# Patient Record
Sex: Female | Born: 2000
Health system: Southern US, Community
[De-identification: ages and names within clinical notes are randomized; demographics above are authoritative.]

## PROBLEM LIST (undated history)

## (undated) ENCOUNTER — Inpatient Hospital Stay (HOSPITAL_COMMUNITY): Payer: Self-pay

## (undated) ENCOUNTER — Emergency Department (HOSPITAL_COMMUNITY): Payer: MEDICAID

## (undated) DIAGNOSIS — D649 Anemia, unspecified: Secondary | ICD-10-CM

## (undated) DIAGNOSIS — Z789 Other specified health status: Secondary | ICD-10-CM

## (undated) HISTORY — DX: Anemia, unspecified: D64.9

## (undated) HISTORY — PX: NO PAST SURGERIES: SHX2092

---
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
## 2023-01-06 DIAGNOSIS — N3090 Cystitis, unspecified without hematuria: Secondary | ICD-10-CM

## 2023-01-06 HISTORY — DX: Cystitis, unspecified without hematuria: N30.90

## 2023-01-06 NOTE — L&D Delivery Note (Signed)
 [facility] Delivery Note  Allison Stout is a 23 y.o. G1P1001 s/p SVD at [redacted]w[redacted]d. She was admitted for IOL for borderline FGR with elevated dopplers.   ROM: 9h 12m with clear fluid GBS Status: Positive/-- (04/22 0000) adequate treatment   Labor Progress: Initial SVE: closed/thick/high. She then progressed to complete.   Delivery Date/Time: 10/14/2023 at 1633 Delivery: Called to room and patient was complete and pushing. Head delivered LOA. No nuchal cord present. Shoulder and body delivered in usual fashion. Infant with spontaneous cry, placed on mother's abdomen, dried and stimulated. Cord clamped x 2 after 1-minute delay, and cut by FOB. Cord blood drawn. Placenta delivered spontaneously with gentle cord traction. Fundus firm with massage and Pitocin. Labia, perineum, vagina, and cervix inspected inspected with bilateral first degree labial lacerations observed. Repaired with 3-0 vicryl in the usual fashion.   Baby Weight: 2460g  Placenta: Sent to L&D Complications: None Lacerations: Bilateral 1st degree labial lacerations EBL: 150 mL Analgesia: Epidural   Infant:  APGAR (1 MIN): 7  APGAR (5 MINS): 9

## 2023-03-10 ENCOUNTER — Inpatient Hospital Stay (HOSPITAL_COMMUNITY)
Admission: AD | Admit: 2023-03-10 | Discharge: 2023-03-11 | Disposition: A | Discharge status: Home / Self Care | Payer: MEDICAID | Attending: Obstetrics and Gynecology | Admitting: Obstetrics and Gynecology

## 2023-03-10 DIAGNOSIS — O26899 Other specified pregnancy related conditions, unspecified trimester: Secondary | ICD-10-CM

## 2023-03-10 DIAGNOSIS — R102 Pelvic and perineal pain: Secondary | ICD-10-CM | POA: Insufficient documentation

## 2023-03-10 DIAGNOSIS — O26891 Other specified pregnancy related conditions, first trimester: Secondary | ICD-10-CM | POA: Insufficient documentation

## 2023-03-10 DIAGNOSIS — Z3A01 Less than 8 weeks gestation of pregnancy: Secondary | ICD-10-CM

## 2023-03-10 DIAGNOSIS — O3680X0 Pregnancy with inconclusive fetal viability, not applicable or unspecified: Secondary | ICD-10-CM

## 2023-03-10 LAB — POCT PREGNANCY, URINE: Preg Test, Ur: POSITIVE — AB

## 2023-03-11 ENCOUNTER — Inpatient Hospital Stay (HOSPITAL_COMMUNITY): Payer: MEDICAID

## 2023-03-11 ENCOUNTER — Encounter (HOSPITAL_COMMUNITY): Payer: Self-pay | Admitting: Obstetrics and Gynecology

## 2023-03-11 DIAGNOSIS — Z3A01 Less than 8 weeks gestation of pregnancy: Secondary | ICD-10-CM

## 2023-03-11 DIAGNOSIS — O3680X0 Pregnancy with inconclusive fetal viability, not applicable or unspecified: Secondary | ICD-10-CM

## 2023-03-11 DIAGNOSIS — R109 Unspecified abdominal pain: Secondary | ICD-10-CM

## 2023-03-11 DIAGNOSIS — O26891 Other specified pregnancy related conditions, first trimester: Secondary | ICD-10-CM

## 2023-03-11 LAB — COMPREHENSIVE METABOLIC PANEL
ALT: 18 U/L (ref 0–44)
AST: 17 U/L (ref 15–41)
Albumin: 3.5 g/dL (ref 3.5–5.0)
Alkaline Phosphatase: 39 U/L (ref 38–126)
Anion gap: 7 (ref 5–15)
BUN: 7 mg/dL (ref 6–20)
CO2: 23 mmol/L (ref 22–32)
Calcium: 9.3 mg/dL (ref 8.9–10.3)
Chloride: 105 mmol/L (ref 98–111)
Creatinine, Ser: 0.54 mg/dL (ref 0.44–1.00)
GFR, Estimated: >60 mL/min (ref >60)
Glucose, Bld: 96 mg/dL (ref 70–99)
Potassium: 3.7 mmol/L (ref 3.5–5.1)
Sodium: 135 mmol/L (ref 135–145)
Total Bilirubin: 0.5 mg/dL (ref 0.0–1.2)
Total Protein: 7.8 g/dL (ref 6.5–8.1)

## 2023-03-11 LAB — WET PREP, GENITAL
Clue Cells Wet Prep HPF POC: NONE SEEN
Sperm: NONE SEEN
Trich, Wet Prep: NONE SEEN
WBC, Wet Prep HPF POC: <10 (ref <10)
Yeast Wet Prep HPF POC: NONE SEEN

## 2023-03-11 LAB — CBC
HCT: 29.2 % — ABNORMAL LOW (ref 36.0–46.0)
Hemoglobin: 8.6 g/dL — ABNORMAL LOW (ref 12.0–15.0)
MCH: 21.2 pg — ABNORMAL LOW (ref 26.0–34.0)
MCHC: 29.5 g/dL — ABNORMAL LOW (ref 30.0–36.0)
MCV: 71.9 fL — ABNORMAL LOW (ref 80.0–100.0)
Platelets: 471 10*3/uL — ABNORMAL HIGH (ref 150–400)
RBC: 4.06 MIL/uL (ref 3.87–5.11)
RDW: 18.3 % — ABNORMAL HIGH (ref 11.5–15.5)
WBC: 8.4 10*3/uL (ref 4.0–10.5)
nRBC: 0 % (ref 0.0–0.2)

## 2023-03-11 LAB — HIV ANTIBODY (ROUTINE TESTING W REFLEX): HIV Screen 4th Generation wRfx: NONREACTIVE

## 2023-03-11 LAB — GC/CHLAMYDIA PROBE AMP (~~LOC~~) NOT AT ARMC
Chlamydia: NEGATIVE
Comment: NEGATIVE
Comment: NORMAL
Neisseria Gonorrhea: NEGATIVE

## 2023-03-11 LAB — ABO/RH: ABO/RH(D): O POS

## 2023-03-11 LAB — HCG, QUANTITATIVE, PREGNANCY: hCG, Beta Chain, Quant, S: 16012 m[IU]/mL — ABNORMAL HIGH (ref <5)

## 2023-03-11 NOTE — MAU Provider Note (Signed)
 Chief Complaint: Possible Pregnancy and Abdominal Pain   Event Date/Time   First Provider Initiated Contact with Patient 03/11/23 0236        SUBJECTIVE HPI: Allison Stout is a 23 y.o. No obstetric history on file. at Unknown by LMP who presents to maternity admissions reporting positive pregnancy test and abdominal cramping.  . She denies vaginal bleeding, vaginal itching/burning, urinary symptoms, n/v, or fever/chills.     Possible Pregnancy This is a new problem. Associated symptoms include abdominal pain. Pertinent negatives include no chills or nausea.  Abdominal Pain The current episode started today. The problem occurs rarely. The problem has been resolved since onset. The pain is located in the suprapubic region. The patient is experiencing no pain. Pertinent negatives include no diarrhea, dysuria or nausea. Past treatments include nothing.   RN Note: Allison Stout is a 24 y.o. at Unknown here in MAU reporting: had positive pregnancy test a couple of days ago. Had abdominal cramping earlier around 1pm, went away on its own.  LMP: middle of Jan  Pain score: 0/10  No past medical history on file. No past surgical history on file. Social History   Socioeconomic History   Marital status: Single    Spouse name: Not on file   Number of children: Not on file   Years of education: Not on file   Highest education level: Not on file  Occupational History   Not on file  Tobacco Use   Smoking status: Passive Smoke Exposure - Never Smoker   Smokeless tobacco: Not on file  Substance and Sexual Activity   Alcohol use: No   Drug use: No   Sexual activity: Never  Other Topics Concern   Not on file  Social History Narrative   Not on file   Social Drivers of Health   Financial Resource Strain: Not on file  Food Insecurity: Not on file  Transportation Needs: Not on file  Physical Activity: Not on file  Stress: Not on file  Social Connections: Not on file  Intimate Partner  Violence: Not on file   No current facility-administered medications on file prior to encounter.   No current outpatient medications on file prior to encounter.   No Known Allergies  I have reviewed patient's Past Medical Hx, Surgical Hx, Family Hx, Social Hx, medications and allergies.   ROS:  Review of Systems  Constitutional:  Negative for chills.  Gastrointestinal:  Positive for abdominal pain. Negative for diarrhea and nausea.  Genitourinary:  Negative for dysuria.   Review of Systems  Other systems negative   Physical Exam  Physical Exam Patient Vitals for the past 24 hrs:  BP Temp Temp src Pulse Resp SpO2 Height Weight  03/11/23 0157 125/62 98.2 F (36.8 C) Oral 76 16 100 % 5\' 2"  (1.575 m) 107.8 kg   Constitutional: Well-developed, well-nourished female in no acute distress.  Cardiovascular: normal rate Respiratory: normal effort GI: Abd soft, non-tender.  MS: Extremities nontender, no edema, normal ROM Neurologic: Alert and oriented x 4.  GU: Neg CVAT.  PELVIC EXAM: deferred  LAB RESULTS Results for orders placed or performed during the hospital encounter of 03/10/23 (from the past 24 hours)  Pregnancy, urine POC     Status: Abnormal   Collection Time: 03/10/23 11:30 PM  Result Value Ref Range   Preg Test, Ur POSITIVE (A) NEGATIVE  CBC     Status: Abnormal   Collection Time: 03/11/23  1:33 AM  Result Value Ref Range   WBC  8.4 4.0 - 10.5 K/uL   RBC 4.06 3.87 - 5.11 MIL/uL   Hemoglobin 8.6 (L) 12.0 - 15.0 g/dL   HCT 16.1 (L) 09.6 - 04.5 %   MCV 71.9 (L) 80.0 - 100.0 fL   MCH 21.2 (L) 26.0 - 34.0 pg   MCHC 29.5 (L) 30.0 - 36.0 g/dL   RDW 40.9 (H) 81.1 - 91.4 %   Platelets 471 (H) 150 - 400 K/uL   nRBC 0.0 0.0 - 0.2 %  hCG, quantitative, pregnancy     Status: Abnormal   Collection Time: 03/11/23  1:33 AM  Result Value Ref Range   hCG, Beta Chain, Quant, S 16,012 (H) <5 mIU/mL  ABO/Rh     Status: None   Collection Time: 03/11/23  1:33 AM  Result Value  Ref Range   ABO/RH(D) O POS    No rh immune globuloin      NOT A RH IMMUNE GLOBULIN CANDIDATE, PT RH POSITIVE Performed at [facility], [address], [place], [place] [address]   Comprehensive metabolic panel     Status: None   Collection Time: 03/11/23  1:33 AM  Result Value Ref Range   Sodium 135 135 - 145 mmol/L   Potassium 3.7 3.5 - 5.1 mmol/L   Chloride 105 98 - 111 mmol/L   CO2 23 22 - 32 mmol/L   Glucose, Bld 96 70 - 99 mg/dL   BUN 7 6 - 20 mg/dL   Creatinine, Ser 6.21 0.44 - 1.00 mg/dL   Calcium 9.3 8.9 - 30.8 mg/dL   Total Protein 7.8 6.5 - 8.1 g/dL   Albumin 3.5 3.5 - 5.0 g/dL   AST 17 15 - 41 U/L   ALT 18 0 - 44 U/L   Alkaline Phosphatase 39 38 - 126 U/L   Total Bilirubin 0.5 0.0 - 1.2 mg/dL   GFR, Estimated >65 >78 mL/min   Anion gap 7 5 - 15  HIV Antibody (routine testing w rflx)     Status: None   Collection Time: 03/11/23  1:33 AM  Result Value Ref Range   HIV Screen 4th Generation wRfx Non Reactive Non Reactive  Wet prep, genital     Status: None   Collection Time: 03/11/23  2:00 AM  Result Value Ref Range   Yeast Wet Prep HPF POC NONE SEEN NONE SEEN   Trich, Wet Prep NONE SEEN NONE SEEN   Clue Cells Wet Prep HPF POC NONE SEEN NONE SEEN   WBC, Wet Prep HPF POC <10 <10   Sperm NONE SEEN     --/--/O POS (03/06 0133)  IMAGING US OB LESS THAN 14 WEEKS WITH OB TRANSVAGINAL Result Date: 03/11/2023 CLINICAL DATA:  Cramping. Positive urine pregnancy test. Beta HCG pending. LMP mid-January EXAM: OBSTETRIC <14 WK [place] AND TRANSVAGINAL OB US TECHNIQUE: Both transabdominal and transvaginal ultrasound examinations were performed for complete evaluation of the gestation as well as the maternal uterus, adnexal regions, and pelvic cul-de-sac. Transvaginal technique was performed to assess early pregnancy. COMPARISON:  None Available. FINDINGS: Intrauterine gestational sac: Single Yolk sac:  Visualized. Embryo:  Visualized. Cardiac Activity: Visualized. Heart Rate: 119  bpm CRL:  4.6 mm   6 w   1 d                  [place] EDC: 11/03/2023 Subchorionic hemorrhage:  None visualized. Maternal uterus/adnexae: Normal ovaries.  No free fluid. IMPRESSION: Single living intrauterine gestation as described. No unexpected sonographic findings. Electronically  Signed   By: Minerva Fester M.D.   On: 03/11/2023 02:54     MAU Management/MDM: I have reviewed the triage vital signs and the nursing notes.   Pertinent labs & imaging results that were available during my care of the patient were reviewed by me and considered in my medical decision making (see chart for details).      I have reviewed her medical records including past results, notes and treatments. Medical, Surgical, and family history were reviewed.  Medications and recent lab tests were reviewed  Ordered usual first trimester r/o ectopic labs.   Pelvic cultures done Will check baseline Ultrasound to rule out ectopic.   This bleeding/pain can represent a normal pregnancy with bleeding, spontaneous abortion or even an ectopic which can be life-threatening.  The process as listed above helps to determine which of these is present.  Reviewed findings with patient who is very pleased. Discussed starting prenatal care  ASSESSMENT Single IUP at [redacted]w[redacted]d Pelvic pain, resolved Pregnancy unknown location, now found to be intrauterine  PLAN Discharge home List of OB providers given  Pt stable at time of discharge. Encouraged to return here if she develops worsening of symptoms, increase in pain, fever, or other concerning symptoms.    Wynelle Bourgeois CNM, MSN Certified Nurse-Midwife 03/11/2023  2:36 AM

## 2023-03-11 NOTE — MAU Note (Signed)
..  Allison Stout is a 23 y.o. at Unknown here in MAU reporting: had positive pregnancy test a couple of days ago. Had abdominal cramping earlier around 1pm, went away on its own.  LMP: middle of Jan  Pain score: 0/10 Vitals:   03/11/23 0157  BP: 125/62  Pulse: 76  Resp: 16  Temp: 98.2 F (36.8 C)  SpO2: 100%     FHT:n/a

## 2023-04-16 ENCOUNTER — Ambulatory Visit: Payer: Self-pay | Admitting: Family Medicine

## 2023-04-22 ENCOUNTER — Inpatient Hospital Stay (HOSPITAL_COMMUNITY)
Admission: AD | Admit: 2023-04-22 | Discharge: 2023-04-23 | Discharge status: Against Medical Advice | Payer: MEDICAID | Attending: Obstetrics & Gynecology | Admitting: Obstetrics & Gynecology

## 2023-04-22 DIAGNOSIS — W19XXXA Unspecified fall, initial encounter: Secondary | ICD-10-CM | POA: Insufficient documentation

## 2023-04-22 DIAGNOSIS — Z3A12 12 weeks gestation of pregnancy: Secondary | ICD-10-CM

## 2023-04-22 DIAGNOSIS — M545 Low back pain, unspecified: Secondary | ICD-10-CM | POA: Insufficient documentation

## 2023-04-22 DIAGNOSIS — Z5321 Procedure and treatment not carried out due to patient leaving prior to being seen by health care provider: Secondary | ICD-10-CM | POA: Insufficient documentation

## 2023-04-22 LAB — URINALYSIS, ROUTINE W REFLEX MICROSCOPIC
Bacteria, UA: NONE SEEN
Bilirubin Urine: NEGATIVE
Glucose, UA: NEGATIVE mg/dL
Ketones, ur: NEGATIVE mg/dL
Nitrite: NEGATIVE
Protein, ur: NEGATIVE mg/dL
Specific Gravity, Urine: 1.008 (ref 1.005–1.030)
pH: 6 (ref 5.0–8.0)

## 2023-04-22 NOTE — MAU Note (Signed)
.  Allison Stout is a 23 y.o. at [redacted]w[redacted]d here in MAU reporting falling back on her buttocks about 1915. Denies vag bleeding. Having some lower back pain.   LMP: n/a Onset of complaint: 1915 Pain score: 6 Vitals:   04/22/23 2045  Pulse: 78  Resp: 17  Temp: 98.1 F (36.7 C)  SpO2: 100%     FHT: 155  Lab orders placed from triage: u/a

## 2023-04-23 MED ORDER — CYCLOBENZAPRINE HCL 5 MG PO TABS: 10.0000 mg | ORAL_TABLET | Freq: Once | ORAL | Status: DC

## 2023-04-23 NOTE — MAU Note (Signed)
 Pt has been called twice and not in lobby.

## 2023-04-23 NOTE — MAU Note (Signed)
 Pt not in lobby.

## 2023-04-27 ENCOUNTER — Inpatient Hospital Stay (HOSPITAL_COMMUNITY)
Admission: AD | Admit: 2023-04-27 | Discharge: 2023-04-27 | Disposition: A | Discharge status: Home / Self Care | Payer: MEDICAID | Attending: Obstetrics and Gynecology | Admitting: Obstetrics and Gynecology

## 2023-04-27 ENCOUNTER — Encounter (HOSPITAL_COMMUNITY): Payer: Self-pay | Admitting: Obstetrics and Gynecology

## 2023-04-27 DIAGNOSIS — Z3A12 12 weeks gestation of pregnancy: Secondary | ICD-10-CM | POA: Insufficient documentation

## 2023-04-27 DIAGNOSIS — O2311 Infections of bladder in pregnancy, first trimester: Secondary | ICD-10-CM | POA: Insufficient documentation

## 2023-04-27 DIAGNOSIS — N3000 Acute cystitis without hematuria: Secondary | ICD-10-CM

## 2023-04-27 DIAGNOSIS — M62838 Other muscle spasm: Secondary | ICD-10-CM | POA: Insufficient documentation

## 2023-04-27 DIAGNOSIS — B951 Streptococcus, group B, as the cause of diseases classified elsewhere: Secondary | ICD-10-CM | POA: Insufficient documentation

## 2023-04-27 HISTORY — DX: Other specified health status: Z78.9

## 2023-04-27 LAB — WET PREP, GENITAL
Clue Cells Wet Prep HPF POC: NONE SEEN
Sperm: NONE SEEN
Trich, Wet Prep: NONE SEEN
WBC, Wet Prep HPF POC: >=10 — AB (ref <10)
Yeast Wet Prep HPF POC: NONE SEEN

## 2023-04-27 LAB — URINALYSIS, ROUTINE W REFLEX MICROSCOPIC
Bilirubin Urine: NEGATIVE
Glucose, UA: NEGATIVE mg/dL
Hgb urine dipstick: NEGATIVE
Ketones, ur: NEGATIVE mg/dL
Nitrite: NEGATIVE
Protein, ur: 30 mg/dL — AB
Specific Gravity, Urine: 1.029 (ref 1.005–1.030)
pH: 5 (ref 5.0–8.0)

## 2023-04-27 LAB — OB RESULTS CONSOLE GBS: GBS: POSITIVE

## 2023-04-27 MED ORDER — CEPHALEXIN 500 MG PO CAPS: 500.0000 mg | ORAL_CAPSULE | Freq: Two times a day (BID) | ORAL | 0 refills | Status: AC

## 2023-04-27 MED ORDER — ONDANSETRON 4 MG PO TBDP: 4.0000 mg | ORAL_TABLET | Freq: Once | ORAL | Status: DC

## 2023-04-27 MED ORDER — CYCLOBENZAPRINE HCL 5 MG PO TABS: 5.0000 mg | ORAL_TABLET | Freq: Three times a day (TID) | ORAL | 0 refills | Status: DC | PRN

## 2023-04-27 NOTE — MAU Note (Signed)
 Allison Stout is a 23 y.o. at [redacted]w[redacted]d here in MAU reporting: pt eating [employer] when called into triage.  Started throwing up  Sunday  until last night , was consistent.  After she quit throwing up, she started having pain in her lower stomach. Started about midnight last night.  Saw some spotting last night when she wiped after urinating. (Only saw the one time). Doesn't know if she was wiping to hard or what. Had intercourse yesterday morning.  Onset of complaint: yesterday Pain score: 6 Vitals:   04/27/23 1040  BP: 127/68  Pulse: 87  Resp: 17  Temp: 98.9 F (37.2 C)  SpO2: 100%     FHT:152 Lab orders placed from triage:  urine and vag swabs

## 2023-04-27 NOTE — MAU Provider Note (Signed)
 History     CSN: [id number]  Arrival date and time: 04/27/23 1015   Event Date/Time   First Provider Initiated Contact with Patient 04/27/2023 10:40 AM   Chief Complaint  Patient presents with   Abdominal Pain   Vaginal Bleeding    HPI  Allison Stout is a 23 y.o. G1P0 at [redacted]w[redacted]d who presents to the MAU for lower abdominal pain. Started yesterday. Reports nausea with vomiting yesterday, then began having lower abdominal pain subsequently. She also noticed some spotting (red blood, no clots) yesterday, but was unsure if it was because she was wiping too aggressively. She reports her main concern is her right low back pain -- started after she fell on her buttocks last week. She presented to the MAU but left w/o being seen at the time. Reports pain has been consistent since then. No numbness/tingling of bilateral lower extremities, loss of bowel/bladder function. Endorses dysuria. No VB currently.  Past Medical History:  Diagnosis Date   Medical history non-contributory     Past Surgical History:  Procedure Laterality Date   NO PAST SURGERIES      Family History  Problem Relation Age of Onset   Healthy Mother    Healthy Father     Social History   Tobacco Use   Smoking status: Passive Smoke Exposure - Never Smoker  Vaping Use   Vaping status: Never Used  Substance Use Topics   Alcohol use: No   Drug use: No    Allergies: No Known Allergies  No medications prior to admission.    ROS reviewed and pertinent positives and negatives as documented in HPI.  Physical Exam   Blood pressure 121/81, pulse 91, temperature 98.9 F (37.2 C), temperature source Oral, resp. rate 17, height 5\' 2"  (1.575 m), weight 103.4 kg, SpO2 100%.  Physical Exam Constitutional:      General: She is not in acute distress.    Appearance: Normal appearance. She is not ill-appearing.  HENT:     Head: Normocephalic and atraumatic.  Cardiovascular:     Rate and Rhythm: Normal rate.   Pulmonary:     Effort: Pulmonary effort is normal.     Breath sounds: Normal breath sounds.  Abdominal:     Palpations: Abdomen is soft.     Tenderness: There is no abdominal tenderness. There is no guarding.  Musculoskeletal:        General: Normal range of motion.     Comments: Right lumbar paraspinal muscle tenderness  Skin:    General: Skin is warm and dry.     Findings: No rash.  Neurological:     General: No focal deficit present.     Mental Status: She is alert and oriented to person, place, and time.     MAU Course  Procedures  MDM 23 y.o. G1P0 at [redacted]w[redacted]d presenting for abdominal pain and right sided low back pain. Abdominal pain is mainly suprapubic and accompanied by complaint of dysuria. Did have postcoital spotting yesterday, none today. She also reports fall last week onto buttocks w consistent pain. Has not tried medication for low back pain. Discussed conservative management w Tylenol, heat, Rx for Flexeril  vs XR and pt would like to start w conservative measures. Return precautions given.  Assessment and Plan     ICD-10-CM   1. Acute cystitis without hematuria  N30.00     2. Muscle spasm  M62.838     Rx for Keflex  sent for acute cystitis, urine cx ordered Rx  for Flexeril  sent, rec Tylenol and heat for paraspinal muscle tenderness, discussed return precautions and would order XR if pain refractory to above   Melanie Spires, MD OB Fellow, Faculty [facility], Center for [facility]  04/27/2023, 4:07 PM

## 2023-04-28 LAB — CULTURE, OB URINE: Culture: 10000 — AB

## 2023-04-28 LAB — GC/CHLAMYDIA PROBE AMP (~~LOC~~) NOT AT ARMC
Chlamydia: NEGATIVE
Comment: NEGATIVE
Comment: NORMAL
Neisseria Gonorrhea: NEGATIVE

## 2023-04-29 ENCOUNTER — Encounter: Payer: Self-pay | Admitting: Obstetrics and Gynecology

## 2023-04-29 DIAGNOSIS — Z22330 Carrier of Group B streptococcus: Secondary | ICD-10-CM | POA: Insufficient documentation

## 2023-05-01 NOTE — Progress Notes (Signed)
 GBS bacteruria, at 10,000 CFU so no indication for tx but will tx intrapartum. Has >100,000 diphtheroids, Keflex  should cover (susceptibilities not available per report).

## 2023-06-12 ENCOUNTER — Encounter (HOSPITAL_COMMUNITY): Payer: Self-pay | Admitting: Obstetrics & Gynecology

## 2023-06-12 ENCOUNTER — Inpatient Hospital Stay (HOSPITAL_COMMUNITY)
Admission: AD | Admit: 2023-06-12 | Discharge: 2023-06-12 | Disposition: A | Discharge status: Home / Self Care | Payer: Self-pay | Attending: Obstetrics & Gynecology | Admitting: Obstetrics & Gynecology

## 2023-06-12 ENCOUNTER — Inpatient Hospital Stay (HOSPITAL_COMMUNITY): Payer: Self-pay

## 2023-06-12 ENCOUNTER — Other Ambulatory Visit: Payer: Self-pay

## 2023-06-12 DIAGNOSIS — O4692 Antepartum hemorrhage, unspecified, second trimester: Secondary | ICD-10-CM | POA: Insufficient documentation

## 2023-06-12 DIAGNOSIS — Z3A19 19 weeks gestation of pregnancy: Secondary | ICD-10-CM

## 2023-06-12 DIAGNOSIS — O0932 Supervision of pregnancy with insufficient antenatal care, second trimester: Secondary | ICD-10-CM | POA: Insufficient documentation

## 2023-06-12 DIAGNOSIS — O209 Hemorrhage in early pregnancy, unspecified: Secondary | ICD-10-CM

## 2023-06-12 DIAGNOSIS — O468X2 Other antepartum hemorrhage, second trimester: Secondary | ICD-10-CM

## 2023-06-12 LAB — DIFFERENTIAL
Abs Immature Granulocytes: 0.03 10*3/uL (ref 0.00–0.07)
Basophils Absolute: 0 10*3/uL (ref 0.0–0.1)
Basophils Relative: 0 %
Eosinophils Absolute: 0.1 10*3/uL (ref 0.0–0.5)
Eosinophils Relative: 1 %
Immature Granulocytes: 0 %
Lymphocytes Relative: 16 %
Lymphs Abs: 1.8 10*3/uL (ref 0.7–4.0)
Monocytes Absolute: 0.7 10*3/uL (ref 0.1–1.0)
Monocytes Relative: 6 %
Neutro Abs: 8.6 10*3/uL — ABNORMAL HIGH (ref 1.7–7.7)
Neutrophils Relative %: 77 %

## 2023-06-12 LAB — URINALYSIS, ROUTINE W REFLEX MICROSCOPIC
Bilirubin Urine: NEGATIVE
Glucose, UA: NEGATIVE mg/dL
Ketones, ur: NEGATIVE mg/dL
Leukocytes,Ua: NEGATIVE
Nitrite: NEGATIVE
Protein, ur: 30 mg/dL — AB
RBC / HPF: >50 RBC/hpf (ref 0–5)
Specific Gravity, Urine: 1.024 (ref 1.005–1.030)
pH: 7 (ref 5.0–8.0)

## 2023-06-12 LAB — CBC
HCT: 30.5 % — ABNORMAL LOW (ref 36.0–46.0)
Hemoglobin: 9.2 g/dL — ABNORMAL LOW (ref 12.0–15.0)
MCH: 21.8 pg — ABNORMAL LOW (ref 26.0–34.0)
MCHC: 30.2 g/dL (ref 30.0–36.0)
MCV: 72.3 fL — ABNORMAL LOW (ref 80.0–100.0)
Platelets: 441 10*3/uL — ABNORMAL HIGH (ref 150–400)
RBC: 4.22 MIL/uL (ref 3.87–5.11)
RDW: 19.6 % — ABNORMAL HIGH (ref 11.5–15.5)
WBC: 11.2 10*3/uL — ABNORMAL HIGH (ref 4.0–10.5)
nRBC: 0 % (ref 0.0–0.2)

## 2023-06-12 LAB — RAPID HIV SCREEN (HIV 1/2 AB+AG)
HIV 1/2 Antibodies: NONREACTIVE
HIV-1 P24 Antigen - HIV24: NONREACTIVE

## 2023-06-12 LAB — TYPE AND SCREEN
ABO/RH(D): O POS
Antibody Screen: NEGATIVE

## 2023-06-12 LAB — WET PREP, GENITAL
Clue Cells Wet Prep HPF POC: NONE SEEN
Sperm: NONE SEEN
Trich, Wet Prep: NONE SEEN
WBC, Wet Prep HPF POC: >=10 — AB (ref <10)
Yeast Wet Prep HPF POC: NONE SEEN

## 2023-06-12 LAB — HIV ANTIBODY (ROUTINE TESTING W REFLEX): HIV Screen 4th Generation wRfx: NONREACTIVE

## 2023-06-12 LAB — HEPATITIS B SURFACE ANTIGEN: Hepatitis B Surface Ag: NONREACTIVE

## 2023-06-12 NOTE — MAU Note (Signed)
 MAU Triage Note  Allison Stout is a 23 y.o. at [redacted]w[redacted]d here in MAU reporting: reports vaginal bleeding since 4pm, she hasn't used a pad, but she's soaked through her shorts that she's had on since 10am. She reports no clots.    Onset of complaint: approx 4pm Pain score: pelvis 1/10 cramp Vitals:   06/12/23 1829  BP: 123/79  Pulse: (!) 125  Resp: 17  Temp: 97.6 F (36.4 C)  SpO2: 100%     FHT: 152  Lab orders placed from triage: UA

## 2023-06-12 NOTE — MAU Provider Note (Cosign Needed Addendum)
 Chief Complaint:  Vaginal Bleeding   HPI    Allison Stout is a 23 y.o. G1P0 at [redacted]w[redacted]d who presents to maternity via EMS with c/o vaginal bleeding since 4 PM. Denies any heavy VB or cramping. She has not yet established PNC. Her Pregnancy was confirmed via [facility]  Pregnancy Course: Un-established   Past Medical History:  Diagnosis Date   Medical history non-contributory    OB History  Gravida Para Term Preterm AB Living  1       SAB IAB Ectopic Multiple Live Births          # Outcome Date GA Lbr Len/2nd Weight Sex Type Anes PTL Lv  1 Current            Past Surgical History:  Procedure Laterality Date   NO PAST SURGERIES     Family History  Problem Relation Age of Onset   Healthy Mother    Healthy Father    Social History   Tobacco Use   Smoking status: Passive Smoke Exposure - Never Smoker  Vaping Use   Vaping status: Never Used  Substance Use Topics   Alcohol use: No   Drug use: No   No Known Allergies No medications prior to admission.    I have reviewed patient's Past Medical Hx, Surgical Hx, Family Hx, Social Hx, medications and allergies.   ROS  Pertinent items noted in HPI and remainder of comprehensive ROS otherwise negative.   PHYSICAL EXAM   Patient Vitals for the past 24 hrs:  BP Temp Temp src Pulse Resp SpO2  06/12/23 2224 117/74 -- -- (!) 107 -- --  06/12/23 2223 94/71 -- -- 97 -- --  06/12/23 2013 124/69 -- -- (!) 101 -- --  06/12/23 1843 114/71 -- -- (!) 122 -- --  06/12/23 1829 123/79 97.6 F (36.4 C) Oral (!) 125 17 100 %    Constitutional: Well-developed, obese female in no acute distress.  Cardiovascular: normal rate & rhythm, warm and well-perfused Respiratory: normal effort, no problems with respiration noted GI: Abd soft, non-tender, gravid MS: Extremities nontender, no edema, normal ROM Neurologic: Alert and oriented x 4.  GU: no CVA tenderness Pelvic: Exam chaperoned by Hulda Mage RN  SSE: No evidence  of blood in the vault, cervix appears erythremic and bleeds to touch , cervix visually closed ( Vaginal Cultures sent)     Fetal Tracing: Via Doppler at 152    Labs: Results for orders placed or performed during the hospital encounter of 06/12/23 (from the past 24 hours)  Urinalysis, Routine w reflex microscopic -Urine, Clean Catch     Status: Abnormal   Collection Time: 06/12/23  6:40 PM  Result Value Ref Range   Color, Urine YELLOW YELLOW   APPearance HAZY (A) CLEAR   Specific Gravity, Urine 1.024 1.005 - 1.030   pH 7.0 5.0 - 8.0   Glucose, UA NEGATIVE NEGATIVE mg/dL   Hgb urine dipstick MODERATE (A) NEGATIVE   Bilirubin Urine NEGATIVE NEGATIVE   Ketones, ur NEGATIVE NEGATIVE mg/dL   Protein, ur 30 (A) NEGATIVE mg/dL   Nitrite NEGATIVE NEGATIVE   Leukocytes,Ua NEGATIVE NEGATIVE   RBC / HPF >50 0 - 5 RBC/hpf   WBC, UA 0-5 0 - 5 WBC/hpf   Bacteria, UA RARE (A) NONE SEEN   Squamous Epithelial / HPF 0-5 0 - 5 /HPF   Mucus PRESENT   Wet prep, genital     Status: Abnormal   Collection Time: 06/12/23  7:23 PM   Specimen: Cervix  Result Value Ref Range   Yeast Wet Prep HPF POC NONE SEEN NONE SEEN   Trich, Wet Prep NONE SEEN NONE SEEN   Clue Cells Wet Prep HPF POC NONE SEEN NONE SEEN   WBC, Wet Prep HPF POC >=10 (A) <10   Sperm NONE SEEN   Hepatitis B surface antigen     Status: None   Collection Time: 06/12/23  8:07 PM  Result Value Ref Range   Hepatitis B Surface Ag NON REACTIVE NON REACTIVE  CBC     Status: Abnormal   Collection Time: 06/12/23  8:07 PM  Result Value Ref Range   WBC 11.2 (H) 4.0 - 10.5 K/uL   RBC 4.22 3.87 - 5.11 MIL/uL   Hemoglobin 9.2 (L) 12.0 - 15.0 g/dL   HCT 95.6 (L) 21.3 - 08.6 %   MCV 72.3 (L) 80.0 - 100.0 fL   MCH 21.8 (L) 26.0 - 34.0 pg   MCHC 30.2 30.0 - 36.0 g/dL   RDW 57.8 (H) 46.9 - 62.9 %   Platelets 441 (H) 150 - 400 K/uL   nRBC 0.0 0.0 - 0.2 %  Differential     Status: Abnormal   Collection Time: 06/12/23  8:07 PM  Result Value Ref  Range   Neutrophils Relative % 77 %   Neutro Abs 8.6 (H) 1.7 - 7.7 K/uL   Lymphocytes Relative 16 %   Lymphs Abs 1.8 0.7 - 4.0 K/uL   Monocytes Relative 6 %   Monocytes Absolute 0.7 0.1 - 1.0 K/uL   Eosinophils Relative 1 %   Eosinophils Absolute 0.1 0.0 - 0.5 K/uL   Basophils Relative 0 %   Basophils Absolute 0.0 0.0 - 0.1 K/uL   Immature Granulocytes 0 %   Abs Immature Granulocytes 0.03 0.00 - 0.07 K/uL  Type and screen [facility]     Status: None   Collection Time: 06/12/23  8:07 PM  Result Value Ref Range   ABO/RH(D) O POS    Antibody Screen NEG    Sample Expiration      06/15/2023,2359 Performed at [facility], [address], [place], [place] [address]   HIV Antibody (routine testing w rflx)     Status: None   Collection Time: 06/12/23  8:07 PM  Result Value Ref Range   HIV Screen 4th Generation wRfx Non Reactive Non Reactive  Rapid HIV screen (HIV 1/2 Ab+Ag)     Status: None   Collection Time: 06/12/23  8:07 PM  Result Value Ref Range   HIV-1 P24 Antigen - HIV24 NON REACTIVE NON REACTIVE   HIV 1/2 Antibodies NON REACTIVE NON REACTIVE   Interpretation (HIV Ag Ab)      A non reactive test result means that HIV 1 or HIV 2 antibodies and HIV 1 p24 antigen were not detected in the specimen.    Imaging:  No results found.  MDM & MAU COURSE  MDM:  HIGH   Vaginal bleeding in pregnancy [redacted] weeks GA ( FHR 152) R/O Vaginal infections  UTI : R/O UTI No Prenatal Care: Routine Prenatal Labs ordered  Suspect vaginitis - No evidence of active infection at this time No active VB - Stable for discharge   MAU Course: Orders Placed This Encounter  Procedures   Wet prep, genital   US  MFM OB Limited   Urinalysis, Routine w reflex microscopic -Urine, Clean Catch   Hepatitis B surface antigen   [place]  screen   RPR   CBC   Differential   HIV Antibody (routine testing w rflx)   Rapid HIV screen (HIV 1/2 Ab+Ag)   Type and screen [facility]   Discharge patient Discharge disposition: 01-Home or Self Care; Discharge patient date: 06/12/2023   Discharge patient    ASSESSMENT   1. Vaginal bleeding affecting early pregnancy   2. Subchorionic hematoma in second trimester, single or unspecified fetus   3. [redacted] weeks gestation of pregnancy     PLAN  Discharge home in stable condition with return precautions.   Establish Prenatal Care ( List of OB Providers given)  See AVS for full description of information given to the patient including both verbal and written. Patient verbalized understanding and agrees with the plan as described above.      Allergies as of 06/12/2023   No Known Allergies      Medication List     TAKE these medications    cyclobenzaprine  5 MG tablet Commonly known as: FLEXERIL  Take 1 tablet (5 mg total) by mouth 3 (three) times daily as needed for muscle spasms. MAY CAUSE DROWSINESS, DO NOT OPERATE HEAVY MACHINERY AFTER TAKING THIS MEDICATION   PRENATAL ADULT GUMMY/DHA/FA PO Take 1 tablet by mouth daily.        Debbe Fail, MSN, [facility], Center for [facility]    Addendum:  Pt with bleeding reported at time of discharge and mild abdominal cramping.  Limited OB US  done to look at placental location.    If no previa identified, plan for discharge with bleeding precautions as previously planned.    Ultrasound showed a moderately large hematoma at edge of placenta.  Discussed that this is the likely cause of her bleeding.  Given its large size, there is a possibility for miscarriage/IUFD, PPROM.  Discussed pelvic rest and lifting restrictions.  Also discussed need to return for evaluation if she begins to pass large clots, has an increase in pain, fever/chills, clear fluid.  Maud Sorenson, MD 12:26 AM

## 2023-06-13 LAB — RPR: RPR Ser Ql: NONREACTIVE

## 2023-06-14 LAB — GC/CHLAMYDIA PROBE AMP (~~LOC~~) NOT AT ARMC
Chlamydia: NEGATIVE
Comment: NEGATIVE
Comment: NORMAL
Neisseria Gonorrhea: NEGATIVE

## 2023-06-15 LAB — RUBELLA SCREEN: Rubella: 1.67 {index} (ref >0.99)

## 2023-06-17 ENCOUNTER — Encounter (HOSPITAL_COMMUNITY): Payer: Self-pay | Admitting: Obstetrics & Gynecology

## 2023-06-17 ENCOUNTER — Inpatient Hospital Stay (HOSPITAL_COMMUNITY): Payer: Self-pay

## 2023-06-17 ENCOUNTER — Inpatient Hospital Stay (HOSPITAL_COMMUNITY)
Admission: AD | Admit: 2023-06-17 | Discharge: 2023-06-17 | Discharge status: Against Medical Advice | Payer: Self-pay | Attending: Obstetrics & Gynecology | Admitting: Obstetrics & Gynecology

## 2023-06-17 DIAGNOSIS — R1031 Right lower quadrant pain: Secondary | ICD-10-CM | POA: Insufficient documentation

## 2023-06-17 DIAGNOSIS — O99212 Obesity complicating pregnancy, second trimester: Secondary | ICD-10-CM

## 2023-06-17 DIAGNOSIS — E669 Obesity, unspecified: Secondary | ICD-10-CM

## 2023-06-17 DIAGNOSIS — O26892 Other specified pregnancy related conditions, second trimester: Secondary | ICD-10-CM

## 2023-06-17 DIAGNOSIS — O99012 Anemia complicating pregnancy, second trimester: Secondary | ICD-10-CM | POA: Insufficient documentation

## 2023-06-17 DIAGNOSIS — Z3A20 20 weeks gestation of pregnancy: Secondary | ICD-10-CM

## 2023-06-17 DIAGNOSIS — R102 Pelvic and perineal pain: Secondary | ICD-10-CM | POA: Insufficient documentation

## 2023-06-17 DIAGNOSIS — R109 Unspecified abdominal pain: Secondary | ICD-10-CM

## 2023-06-17 LAB — URINALYSIS, ROUTINE W REFLEX MICROSCOPIC
Bilirubin Urine: NEGATIVE
Glucose, UA: NEGATIVE mg/dL
Hgb urine dipstick: NEGATIVE
Ketones, ur: NEGATIVE mg/dL
Nitrite: NEGATIVE
Protein, ur: NEGATIVE mg/dL
Specific Gravity, Urine: 1.017 (ref 1.005–1.030)
pH: 6 (ref 5.0–8.0)

## 2023-06-17 LAB — COMPREHENSIVE METABOLIC PANEL WITH GFR
ALT: 13 U/L (ref 0–44)
AST: 14 U/L — ABNORMAL LOW (ref 15–41)
Albumin: 2.8 g/dL — ABNORMAL LOW (ref 3.5–5.0)
Alkaline Phosphatase: 60 U/L (ref 38–126)
Anion gap: 7 (ref 5–15)
BUN: 5 mg/dL — ABNORMAL LOW (ref 6–20)
CO2: 22 mmol/L (ref 22–32)
Calcium: 9.2 mg/dL (ref 8.9–10.3)
Chloride: 105 mmol/L (ref 98–111)
Creatinine, Ser: 0.51 mg/dL (ref 0.44–1.00)
GFR, Estimated: >60 mL/min (ref >60)
Glucose, Bld: 101 mg/dL — ABNORMAL HIGH (ref 70–99)
Potassium: 3.6 mmol/L (ref 3.5–5.1)
Sodium: 134 mmol/L — ABNORMAL LOW (ref 135–145)
Total Bilirubin: 0.2 mg/dL (ref 0.0–1.2)
Total Protein: 6.9 g/dL (ref 6.5–8.1)

## 2023-06-17 LAB — CBC
HCT: 29.2 % — ABNORMAL LOW (ref 36.0–46.0)
Hemoglobin: 8.8 g/dL — ABNORMAL LOW (ref 12.0–15.0)
MCH: 22.1 pg — ABNORMAL LOW (ref 26.0–34.0)
MCHC: 30.1 g/dL (ref 30.0–36.0)
MCV: 73.2 fL — ABNORMAL LOW (ref 80.0–100.0)
Platelets: 411 10*3/uL — ABNORMAL HIGH (ref 150–400)
RBC: 3.99 MIL/uL (ref 3.87–5.11)
RDW: 20 % — ABNORMAL HIGH (ref 11.5–15.5)
WBC: 9.4 10*3/uL (ref 4.0–10.5)
nRBC: 0 % (ref 0.0–0.2)

## 2023-06-17 LAB — WET PREP, GENITAL
Clue Cells Wet Prep HPF POC: NONE SEEN
Sperm: NONE SEEN
Trich, Wet Prep: NONE SEEN
WBC, Wet Prep HPF POC: >=10 — AB
Yeast Wet Prep HPF POC: NONE SEEN

## 2023-06-17 LAB — LIPASE, BLOOD: Lipase: 32 U/L (ref 11–51)

## 2023-06-17 LAB — AMYLASE: Amylase: 76 U/L (ref 28–100)

## 2023-06-17 MED ORDER — ACETAMINOPHEN 500 MG PO TABS
1000.0000 mg | ORAL_TABLET | Freq: Once | ORAL | Status: AC
  Administered 2023-06-17: 1000 mg via ORAL
  Filled 2023-06-17: qty 2

## 2023-06-17 NOTE — Discharge Instructions (Signed)
 Please contact one of the OB providers to establish care. It is vital that you receive routine prenatal care during pregnancy.  [facility] [employer] for [employer] at [employer] [facility]             [address], [place], [place] [address] [phone]  Center for [facility] at [facility]                                                             [address], [place], [place], [address] [phone]  Center for [facility] at [facility] [address], [place], [place], [address] [phone]  Center for [facility] at [facility] [address], [place], [place], [address] [phone]  Center for [facility] at [facility]                                 [address] [place], [place], [place], [address] [phone]  Center for [facility] at [facility]                                    [address], [place], [place], [address] [phone]  Center for [facility] at [facility] [address], [address], [place], [place], [address]                              [facility] [place] [address], [place], [place], [address] [phone]  [facility]         Phone: [phone]  [facility]      Phone: [phone]   [facility]      Phone: [phone]  [facility]-Family [facility]         Phone: [phone]   [facility]-M[facility]    Phone: [phone]  [facility]      Phone: [phone]  [facility]     Phone: [phone]  [facility]        Phone: [phone]  [facility]) [phone]  [facility]      Phone: [phone]

## 2023-06-17 NOTE — MAU Note (Signed)
 Pt had questions about MRI. Did not think she needed an MRI feeling a little better. Notified provider.

## 2023-06-17 NOTE — MAU Provider Note (Signed)
 Chief Complaint:  Abdominal Pain   HPI   Event Date/Time   First Provider Initiated Contact with Patient 06/17/23 1836      Allison Stout is a 23 y.o. G1P0 at [redacted]w[redacted]d who presents to maternity admissions reporting abdominal pain. She was in the MAU on 6/7 with vaginal bleeding, US  showed subchorionic hemorrhage. Vaginal bleeding has since resolved, but today she began having lower abdominal pain, particularly in the RLQ. She describes a constant stabbing pain. She has not tried anything at home for relief. Movement like walking aggravates the pain. Denies nausea, vomiting, diarrhea, constipation, fever, chills. No change in appetite, states she ate some Chipotle earlier today.  Pregnancy Course: Has received OB list several times while at MAU. No prenatal records available for review.  Past Medical History:  Diagnosis Date   Medical history non-contributory    OB History  Gravida Para Term Preterm AB Living  1       SAB IAB Ectopic Multiple Live Births          # Outcome Date GA Lbr Len/2nd Weight Sex Type Anes PTL Lv  1 Current            Past Surgical History:  Procedure Laterality Date   NO PAST SURGERIES     Family History  Problem Relation Age of Onset   Healthy Mother    Healthy Father    Social History   Tobacco Use   Smoking status: Passive Smoke Exposure - Never Smoker  Vaping Use   Vaping status: Never Used  Substance Use Topics   Alcohol use: No   Drug use: No   No Known Allergies Medications Prior to Admission  Medication Sig Dispense Refill Last Dose/Taking   cyclobenzaprine  (FLEXERIL ) 5 MG tablet Take 1 tablet (5 mg total) by mouth 3 (three) times daily as needed for muscle spasms. MAY CAUSE DROWSINESS, DO NOT OPERATE HEAVY MACHINERY AFTER TAKING THIS MEDICATION 20 tablet 0 Past Month   Prenatal MV & Min w/FA-DHA (PRENATAL ADULT GUMMY/DHA/FA PO) Take 1 tablet by mouth daily.   06/17/2023    I have reviewed patient's Past Medical Hx, Surgical Hx,  Family Hx, Social Hx, medications and allergies.   ROS  Pertinent items noted in HPI and remainder of comprehensive ROS otherwise negative.   PHYSICAL EXAM  Patient Vitals for the past 24 hrs:  BP Temp Temp src Pulse Resp SpO2 Height Weight  06/17/23 1802 122/60 98.8 F (37.1 C) Oral 92 18 99 % 5' 2 (1.575 m) 105.6 kg    Constitutional: Well-developed, well-nourished female sitting upright in bed.  HEENT: atraumatic, normocephalic. Neck has normal ROM. EOM intact. Cardiovascular: normal rate & rhythm, warm and well-perfused Respiratory: normal effort, no problems with respiration noted GI: Abd soft, non-distended, normoactive bowel sounds. When LLQ palpated, pain in RLQ. RLQ and RUQ tender to light palpation. MSK: Extremities nontender, no edema, normal ROM Skin: warm and dry. Acyanotic, no jaundice or pallor. Neurologic: Alert and oriented x 4. No abnormal coordination. Psychiatric: Normal mood. Speech not slurred, not rapid/pressured. Patient is cooperative. GU: no CVA tenderness    Labs: Results for orders placed or performed during the hospital encounter of 06/17/23 (from the past 24 hours)  CBC     Status: Abnormal   Collection Time: 06/17/23  6:47 PM  Result Value Ref Range   WBC 9.4 4.0 - 10.5 K/uL   RBC 3.99 3.87 - 5.11 MIL/uL   Hemoglobin 8.8 (L) 12.0 - 15.0 g/dL  HCT 29.2 (L) 36.0 - 46.0 %   MCV 73.2 (L) 80.0 - 100.0 fL   MCH 22.1 (L) 26.0 - 34.0 pg   MCHC 30.1 30.0 - 36.0 g/dL   RDW 13.2 (H) 44.0 - 10.2 %   Platelets 411 (H) 150 - 400 K/uL   nRBC 0.0 0.0 - 0.2 %  Comprehensive metabolic panel with GFR     Status: Abnormal   Collection Time: 06/17/23  6:47 PM  Result Value Ref Range   Sodium 134 (L) 135 - 145 mmol/L   Potassium 3.6 3.5 - 5.1 mmol/L   Chloride 105 98 - 111 mmol/L   CO2 22 22 - 32 mmol/L   Glucose, Bld 101 (H) 70 - 99 mg/dL   BUN 5 (L) 6 - 20 mg/dL   Creatinine, Ser 7.25 0.44 - 1.00 mg/dL   Calcium 9.2 8.9 - 36.6 mg/dL   Total Protein 6.9  6.5 - 8.1 g/dL   Albumin 2.8 (L) 3.5 - 5.0 g/dL   AST 14 (L) 15 - 41 U/L   ALT 13 0 - 44 U/L   Alkaline Phosphatase 60 38 - 126 U/L   Total Bilirubin 0.2 0.0 - 1.2 mg/dL   GFR, Estimated >44 >03 mL/min   Anion gap 7 5 - 15  Amylase     Status: None   Collection Time: 06/17/23  6:47 PM  Result Value Ref Range   Amylase 76 28 - 100 U/L  Lipase, blood     Status: None   Collection Time: 06/17/23  6:47 PM  Result Value Ref Range   Lipase 32 11 - 51 U/L  Wet prep, genital     Status: Abnormal   Collection Time: 06/17/23  7:30 PM   Specimen: PATH Cytology Cervicovaginal Ancillary Only  Result Value Ref Range   Yeast Wet Prep HPF POC NONE SEEN NONE SEEN   Trich, Wet Prep NONE SEEN NONE SEEN   Clue Cells Wet Prep HPF POC NONE SEEN NONE SEEN   WBC, Wet Prep HPF POC >=10 (A) <10   Sperm NONE SEEN   Urinalysis, Routine w reflex microscopic -Urine, Clean Catch     Status: Abnormal   Collection Time: 06/17/23  8:01 PM  Result Value Ref Range   Color, Urine YELLOW YELLOW   APPearance HAZY (A) CLEAR   Specific Gravity, Urine 1.017 1.005 - 1.030   pH 6.0 5.0 - 8.0   Glucose, UA NEGATIVE NEGATIVE mg/dL   Hgb urine dipstick NEGATIVE NEGATIVE   Bilirubin Urine NEGATIVE NEGATIVE   Ketones, ur NEGATIVE NEGATIVE mg/dL   Protein, ur NEGATIVE NEGATIVE mg/dL   Nitrite NEGATIVE NEGATIVE   Leukocytes,Ua LARGE (A) NEGATIVE   RBC / HPF 0-5 0 - 5 RBC/hpf   WBC, UA 11-20 0 - 5 WBC/hpf   Bacteria, UA RARE (A) NONE SEEN   Squamous Epithelial / HPF 0-5 0 - 5 /HPF   Mucus PRESENT     Imaging:  No results found.  MDM & MAU COURSE  MDM: High  MAU Course: -Vital signs within normal limits. Normotensive and afebrile. -CBC and CMP to rule out infection, electrolyte abnormalities, renal or hepatic abnormalities -Wet prep and UA for infectious causes of lower abdominal pain. -Lipase and amylase to rule out pancreatitis. -US  to rule out placental abruption given previous subchorionic hemorrhage which  increases risk. -CBC with normal WBC but worsening anemia, Hgb now 8.8. Referral placed for IV iron. -CMP within normal limits for pregnancy. Lipase and amylase  within normal limits. -Wet prep and UA negative.  -US  without placental abruption. Notes hemorrhagic collection 4.4 x 4.9 x 2.4 cm. This is smaller than size on US  6/7. -Must rule out appendicitis given level of tenderness to palpation on exam. Noncontrast MRI abdomen pelvis. - When nurse bring patient back to provider to discuss imaging with patient, discussed risks and benefits.  Reassured patient that MRI is safe imaging in pregnancy. Patient declines MRI, states it is a lot. Discussed that patient will be leaving AGAINST MEDICAL ADVICE, patient verbalized understanding and still declines.  Differential diagnosis considered for abdominal pain includes but is not limited to: round ligament pain, cholecystitis, appendicitis, UTI, pyelonephritis, nephrolithiasis, PID, cervicitis, placental abruption, diverticulitis   Orders Placed This Encounter  Procedures   Wet prep, genital   US  MFM OB LIMITED   MR ABDOMEN WO CONTRAST   MR PELVIS WO CONTRAST   Urinalysis, Routine w reflex microscopic -Urine, Clean Catch   CBC   Comprehensive metabolic panel with GFR   Amylase   Lipase, blood   Amb Referral to Intravenous Iron Therapy   Meds ordered this encounter  Medications   acetaminophen (TYLENOL) tablet 1,000 mg    ASSESSMENT   1. Abdominal pain during pregnancy in second trimester   2. Anemia affecting pregnancy in second trimester   3. [redacted] weeks gestation of pregnancy     PLAN  Patient left AMA without MRI. Discussed return precautions for worsening pain, fever, chills, nausea/vomiting, vaginal bleeding. Recommended establishing prenatal care, provided OB list again.    Allergies as of 06/17/2023   No Known Allergies      Medication List     TAKE these medications    cyclobenzaprine  5 MG tablet Commonly known as:  FLEXERIL  Take 1 tablet (5 mg total) by mouth 3 (three) times daily as needed for muscle spasms. MAY CAUSE DROWSINESS, DO NOT OPERATE HEAVY MACHINERY AFTER TAKING THIS MEDICATION   PRENATAL ADULT GUMMY/DHA/FA PO Take 1 tablet by mouth daily.        Noreene Bearded, PA

## 2023-06-17 NOTE — MAU Note (Addendum)
 Allison Stout is a 23 y.o. at [redacted]w[redacted]d here in MAU reporting: Pt arrived by EMS. Was here on Sat for bleeding.  (Has a subchorionic hemorrhage per the US  report). Bleeding stopped on Monday. Started having pain in lower abd today.  Feels like she is being stabbed in lower abd.  Was worried and didn't want to risk anything to the baby.   Onset of complaint: this morning when she got up, 10 x worse than she had ever felt before.  Pain score: 8 Vitals:   06/17/23 1802  BP: 122/60  Pulse: 92  Resp: 18  Temp: 98.8 F (37.1 C)  SpO2: 99%     FHT:150 Lab orders placed from triage:  urine   Tripped over crock shoe this afternoon, fell onto her knees.   This did not affect or increase the pain.

## 2023-06-18 ENCOUNTER — Telehealth: Payer: Self-pay | Admitting: Pharmacy Technician

## 2023-06-18 LAB — GC/CHLAMYDIA PROBE AMP (~~LOC~~) NOT AT ARMC
Chlamydia: NEGATIVE
Comment: NEGATIVE
Comment: NORMAL
Neisseria Gonorrhea: NEGATIVE

## 2023-07-01 NOTE — Addendum Note (Signed)
 Encounter addended by: Nicholaus Almarie HERO, MD on: 07/01/2023 3:44 PM  Actions taken: Letter saved

## 2023-07-13 ENCOUNTER — Telehealth: Payer: Self-pay | Admitting: Family Medicine

## 2023-07-13 DIAGNOSIS — O99012 Anemia complicating pregnancy, second trimester: Secondary | ICD-10-CM | POA: Insufficient documentation

## 2023-07-13 DIAGNOSIS — D509 Iron deficiency anemia, unspecified: Secondary | ICD-10-CM | POA: Insufficient documentation

## 2023-07-13 NOTE — Telephone Encounter (Signed)
 Patient referred to infusion pharmacy team for ambulatory infusion of IV iron.  Insurance - None/Free Drug  Site of care - Site of care: [facility] Dx code - O99.012/D50.9 IV Iron Therapy - Monoferric 1g IV x 1 Infusion appointments - Scheduling team will schedule patient as soon as possible.   Allison Stout, PharmD

## 2023-07-15 ENCOUNTER — Telehealth: Payer: Self-pay

## 2023-07-15 NOTE — Telephone Encounter (Signed)
 Patient is receiving Advance Medication - Supplied Externally. Medication: Monoferric Manufacture: [employer] Approval Dates: Approved from 07/13/2023 until 07/12/2024. ID: [id number] Reason: Self Pay First DOS: 07/14/2023

## 2023-07-22 ENCOUNTER — Telehealth (INDEPENDENT_AMBULATORY_CARE_PROVIDER_SITE_OTHER): Payer: Self-pay | Admitting: *Deleted

## 2023-07-22 DIAGNOSIS — O99012 Anemia complicating pregnancy, second trimester: Secondary | ICD-10-CM

## 2023-07-22 DIAGNOSIS — O4690 Antepartum hemorrhage, unspecified, unspecified trimester: Secondary | ICD-10-CM | POA: Insufficient documentation

## 2023-07-22 DIAGNOSIS — O99210 Obesity complicating pregnancy, unspecified trimester: Secondary | ICD-10-CM | POA: Insufficient documentation

## 2023-07-22 DIAGNOSIS — O99212 Obesity complicating pregnancy, second trimester: Secondary | ICD-10-CM

## 2023-07-22 DIAGNOSIS — O2310 Infections of bladder in pregnancy, unspecified trimester: Secondary | ICD-10-CM | POA: Insufficient documentation

## 2023-07-22 DIAGNOSIS — D509 Iron deficiency anemia, unspecified: Secondary | ICD-10-CM

## 2023-07-22 DIAGNOSIS — Z3A25 25 weeks gestation of pregnancy: Secondary | ICD-10-CM

## 2023-07-22 DIAGNOSIS — O2312 Infections of bladder in pregnancy, second trimester: Secondary | ICD-10-CM

## 2023-07-22 DIAGNOSIS — O4692 Antepartum hemorrhage, unspecified, second trimester: Secondary | ICD-10-CM

## 2023-07-22 DIAGNOSIS — Z3490 Encounter for supervision of normal pregnancy, unspecified, unspecified trimester: Secondary | ICD-10-CM | POA: Insufficient documentation

## 2023-07-22 NOTE — Progress Notes (Signed)
 New OB Intake  I connected with Allison Stout  on 07/22/23 at 10:15 AM EDT by MyChart Video Visit and verified that I am speaking with the correct person using two identifiers. Nurse is located at [facility] and pt is located at home.  I discussed the limitations, risks, security and privacy concerns of performing an evaluation and management service by telephone and the availability of in person appointments. I also discussed with the patient that there may be a patient responsible charge related to this service. The patient expressed understanding and agreed to proceed.  I explained I am completing New OB Intake today. We discussed EDD of 11/03/23 based on US  at [redacted]w[redacted]d weeks. Pt is G1P0. I reviewed her allergies, medications and Medical/Surgical/OB history.    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 Discussed the nature of our practice with multiple providers including residents and students. Due to the size of the practice, the delivering provider may not be the same as those providing prenatal care.   Patient is interested in water birth.  MyChart/Babyscripts MyChart access verified. I explained pt will have some visits in office and some virtually. Babyscripts instructions given and order placed.   Blood Pressure Cuff/Weight Scale Patient does not have BP cuff or weight scale. She has applied for Medicaid. Explained we can order RX once approved.  Explained after first prenatal appt pt will check weekly and document in Babyscripts..  Anatomy US  Explained next scheduled US  will be first available as  she is already 25 weeks. Anatomy US  scheduled for first available on 822/25 at 10:00.  Is patient a CenteringPregnancy candidate?  Declined Declined due to Enrolled in [facility]   Is patient a Mom+Baby Combined Care candidate?  Accepted   Confirmed patient does not intend to move from the area for at least 12 months, notified Mom+Baby staff  Is patient a candidate for Babyscripts Optimization? Yes, patient declined   First visit review I reviewed new OB appt with patient. Explained pt will be seen by Dr. Zina at first visit. Discussed [place] genetic screening with patient. She would like both [employer] and [employer].. Explained may need additonal Routine prenatal labs at new ob visit.   Last Pap No results found for: EDMON Rock Skip OBIE 07/22/2023  10:59 AM

## 2023-07-26 ENCOUNTER — Ambulatory Visit: Payer: Self-pay

## 2023-07-29 ENCOUNTER — Encounter: Payer: Self-pay | Admitting: Obstetrics and Gynecology

## 2023-07-29 ENCOUNTER — Other Ambulatory Visit: Payer: Self-pay

## 2023-07-29 ENCOUNTER — Ambulatory Visit: Payer: Self-pay | Admitting: Obstetrics and Gynecology

## 2023-07-29 VITALS — BP 131/77 | HR 78 | Wt 191.1 lb

## 2023-07-29 DIAGNOSIS — Z22330 Carrier of Group B streptococcus: Secondary | ICD-10-CM

## 2023-07-29 DIAGNOSIS — D508 Other iron deficiency anemias: Secondary | ICD-10-CM

## 2023-07-29 DIAGNOSIS — O0932 Supervision of pregnancy with insufficient antenatal care, second trimester: Secondary | ICD-10-CM

## 2023-07-29 DIAGNOSIS — Z3A26 26 weeks gestation of pregnancy: Secondary | ICD-10-CM

## 2023-07-29 DIAGNOSIS — Z3492 Encounter for supervision of normal pregnancy, unspecified, second trimester: Secondary | ICD-10-CM

## 2023-07-29 MED ORDER — ASPIRIN 81 MG PO CHEW: 81.0000 mg | CHEWABLE_TABLET | Freq: Every day | ORAL | 3 refills | Status: DC

## 2023-07-29 NOTE — Patient Instructions (Signed)
   Considering Waterbirth? Guide for patients at Center for [facility]) Why consider waterbirth? Gentle birth for babies  Less pain medicine used in labor  May allow for passive descent/less pushing  May reduce perineal tears  More mobility and instinctive maternal position changes  Increased maternal relaxation   Is waterbirth safe? What are the risks of infection, drowning or other complications? Infection:  Very low risk (3.7 % for tub vs 4.8% for bed)  7 in 8000 waterbirths with documented infection  Poorly cleaned equipment most common cause  Slightly lower group B strep transmission rate  Drowning  Maternal:  Very low risk  Related to seizures or fainting  Newborn:  Very low risk. No evidence of increased risk of respiratory problems in multiple large studies  Physiological protection from breathing under water  Avoid underwater birth if there are any fetal complications  Once baby's head is out of the water, keep it out.  Birth complication  Some reports of cord trauma, but risk decreased by bringing baby to surface gradually  No evidence of increased risk of shoulder dystocia. Mothers can usually change positions faster in water than in a bed, possibly aiding the maneuvers to free the shoulder.   There are 2 things you MUST do to have a waterbirth with [facility]: Attend a waterbirth class at [facility] at [facility]   3rd Wednesday of every month from 7-9 pm (virtual during COVID) [employer] at [email or web address] or [email or web address] or by calling [phone] Bring [place] the certificate from the class to your prenatal appointment or send via MyChart Meet with a midwife at 36 weeks* to see if you can still plan a waterbirth and to sign the consent.   *We also recommend that you schedule as many of your prenatal visits with a midwife as possible.    Helpful information: You may want to bring a bathing suit top to the hospital  to wear during labor but this is optional.  All other supplies are provided by the hospital. Please arrive at the hospital with signs of active labor, and do not wait at home until late in labor. It takes 45 min- 1 hour for fetal monitoring, and check in to your room to take place, plus transport and filling of the waterbirth tub.    Things that would prevent you from having a waterbirth: Premature, <37wks  Previous cesarean birth  Presence of thick meconium-stained fluid  Multiple gestation (Twins, triplets, etc.)  Uncontrolled diabetes or gestational diabetes requiring medication  Hypertension diagnosed in pregnancy or preexisting hypertension (gestational hypertension, preeclampsia, or chronic hypertension) Fetal growth restriction (your baby measures less than 10th percentile on ultrasound) Heavy vaginal bleeding  Non-reassuring fetal heart rate  Active infection (MRSA, etc.). Group B Strep is NOT a contraindication for waterbirth.  If your labor has to be induced and induction method requires continuous monitoring of the baby's heart rate  Other risks/issues identified by your obstetrical provider   Please remember that birth is unpredictable. Under certain unforeseeable circumstances your provider may advise against giving birth in the tub. These decisions will be made on a case-by-case basis and with the safety of you and your baby as our highest priority.    Updated 04/09/21

## 2023-07-29 NOTE — Progress Notes (Signed)
 INITIAL PRENATAL VISIT NOTE  Subjective:  Allison Stout is a 23 y.o. G1P0 at [redacted]w[redacted]d by early u/s being seen today for her initial prenatal visit. She has an obstetric history significant for nulliparity. She has a medical history significant for anemia.  Patient reports no complaints.   . Vag. Bleeding: None.  Movement: Present. Denies leaking of fluid.    Past Medical History:  Diagnosis Date   Anemia    Cystitis 2025   Medical history non-contributory     Past Surgical History:  Procedure Laterality Date   NO PAST SURGERIES      OB History  Gravida Para Term Preterm AB Living  1       SAB IAB Ectopic Multiple Live Births          # Outcome Date GA Lbr Len/2nd Weight Sex Type Anes PTL Lv  1 Current             Social History   Socioeconomic History   Marital status: Single    Spouse name: Not on file   Number of children: Not on file   Years of education: Not on file   Highest education level: Not on file  Occupational History   Not on file  Tobacco Use   Smoking status: Never    Passive exposure: Yes   Smokeless tobacco: Never  Vaping Use   Vaping status: Never Used  Substance and Sexual Activity   Alcohol use: No   Drug use: No   Sexual activity: Yes    Birth control/protection: None  Other Topics Concern   Not on file  Social History Narrative   Not on file   Social Drivers of Health   Financial Resource Strain: Not on file  Food Insecurity: Not on file  Transportation Needs: Not on file  Physical Activity: Not on file  Stress: Not on file  Social Connections: Not on file    Family History  Problem Relation Age of Onset   Healthy Mother    Healthy Father      Current Outpatient Medications:    aspirin  81 MG chewable tablet, Chew 1 tablet (81 mg total) by mouth daily., Disp: 90 tablet, Rfl: 3   Prenatal Vit-Fe Fumarate-FA (PRENATAL VITAMIN PO), Take 1 tablet by mouth daily at 6 (six) AM., Disp: , Rfl:   No Known  Allergies  Review of Systems: Negative except for what is mentioned in HPI.  Objective:   Vitals:   07/29/23 1014  BP: 131/77  Pulse: 78  Weight: 191 lb 1.6 oz (86.7 kg)    Fetal Status: Fetal Heart Rate (bpm): 143   Movement: Present     Physical Exam: BP 131/77   Pulse 78   Wt 191 lb 1.6 oz (86.7 kg)   LMP 01/20/2023 (Approximate)   BMI 34.95 kg/m  CONSTITUTIONAL: Well-developed, well-nourished female in no acute distress.  NEUROLOGIC: Alert and oriented to person, place, and time. Normal reflexes, muscle tone coordination. No cranial nerve deficit noted. PSYCHIATRIC: Normal mood and affect. Normal behavior. Normal judgment and thought content. SKIN: Skin is warm and dry. No rash noted. Not diaphoretic. No erythema. No pallor. HENT:  Normocephalic, atraumatic, External right and left ear normal. Oropharynx is clear and moist EYES: Conjunctivae and EOM are normal.  NECK: Normal range of motion, supple, no masses CARDIOVASCULAR: Normal heart rate noted, regular rhythm RESPIRATORY: Effort and breath sounds normal, no problems with respiration noted BREASTS: deferred ABDOMEN: Soft, nontender, nondistended, gravid. HL:izqzmmzi  MUSCULOSKELETAL: Normal range of motion. EXT:  No edema and no tenderness. 2+ distal pulses.   Assessment and Plan:  Pregnancy: G1P0 at [redacted]w[redacted]d by early ultrasound  1. [redacted] weeks gestation of pregnancy (Primary)   2. Encounter for supervision of low-risk pregnancy in second trimester Continue routine prenatal care Start baby ASA due to risk factors  - Culture, OB Urine - CBC/D/Plt+RPR+Rh+ABO+RubIgG... - Hemoglobin A1c - PANORAMA PRENATAL TEST - HORIZON Basic Panel - aspirin  81 MG chewable tablet; Chew 1 tablet (81 mg total) by mouth daily.  Dispense: 90 tablet; Refill: 3  3. GBS carrier Prophylaxis in labor  4. Other iron deficiency anemia   5. Late prenatal care complicating pregnancy in second trimester    Preterm labor symptoms and  general obstetric precautions including but not limited to vaginal bleeding, contractions, leaking of fluid and fetal movement were reviewed in detail with the patient.  Please refer to After Visit Summary for other counseling recommendations.   Return in about 2 weeks (around 08/12/2023) for ROB, in person, 2 hr GTT.  Jerilynn DELENA Buddle 07/29/2023 10:41 AM

## 2023-07-30 LAB — CBC/D/PLT+RPR+RH+ABO+RUBIGG...
Antibody Screen: NEGATIVE
Basophils Absolute: 0 x10E3/uL (ref 0.0–0.2)
Basos: 0 %
EOS (ABSOLUTE): 0.1 x10E3/uL (ref 0.0–0.4)
Eos: 1 %
HCV Ab: NONREACTIVE
HIV Screen 4th Generation wRfx: NONREACTIVE
Hematocrit: 34.3 % (ref 34.0–46.6)
Hemoglobin: 10.1 g/dL — ABNORMAL LOW (ref 11.1–15.9)
Hepatitis B Surface Ag: NEGATIVE
Immature Grans (Abs): 0 x10E3/uL (ref 0.0–0.1)
Immature Granulocytes: 0 %
Lymphocytes Absolute: 2.5 x10E3/uL (ref 0.7–3.1)
Lymphs: 25 %
MCH: 23.4 pg — ABNORMAL LOW (ref 26.6–33.0)
MCHC: 29.4 g/dL — ABNORMAL LOW (ref 31.5–35.7)
MCV: 80 fL (ref 79–97)
Monocytes Absolute: 0.7 x10E3/uL (ref 0.1–0.9)
Monocytes: 7 %
Neutrophils Absolute: 6.7 x10E3/uL (ref 1.4–7.0)
Neutrophils: 67 %
Platelets: 375 x10E3/uL (ref 150–450)
RBC: 4.31 x10E6/uL (ref 3.77–5.28)
RDW: 18.5 % — ABNORMAL HIGH (ref 11.7–15.4)
RPR Ser Ql: NONREACTIVE
Rh Factor: POSITIVE
Rubella Antibodies, IGG: 1.55 {index} (ref >0.99)
WBC: 10 x10E3/uL (ref 3.4–10.8)

## 2023-07-30 LAB — HCV INTERPRETATION

## 2023-07-30 LAB — HEMOGLOBIN A1C
Est. average glucose Bld gHb Est-mCnc: 105 mg/dL
Hgb A1c MFr Bld: 5.3 % (ref 4.8–5.6)

## 2023-07-31 LAB — CULTURE, OB URINE

## 2023-07-31 LAB — URINE CULTURE, OB REFLEX

## 2023-08-02 ENCOUNTER — Ambulatory Visit: Payer: Self-pay | Admitting: Obstetrics and Gynecology

## 2023-08-05 ENCOUNTER — Ambulatory Visit: Payer: Self-pay

## 2023-08-06 ENCOUNTER — Ambulatory Visit: Payer: Self-pay

## 2023-08-06 LAB — PANORAMA PRENATAL TEST FULL PANEL:PANORAMA TEST PLUS 5 ADDITIONAL MICRODELETIONS: FETAL FRACTION: 11.7

## 2023-08-08 LAB — HORIZON CUSTOM: REPORT SUMMARY: NEGATIVE

## 2023-08-11 ENCOUNTER — Other Ambulatory Visit: Payer: Self-pay

## 2023-08-11 DIAGNOSIS — Z3A28 28 weeks gestation of pregnancy: Secondary | ICD-10-CM

## 2023-08-16 ENCOUNTER — Telehealth: Payer: Self-pay

## 2023-08-18 ENCOUNTER — Other Ambulatory Visit: Payer: Self-pay

## 2023-08-18 ENCOUNTER — Ambulatory Visit (INDEPENDENT_AMBULATORY_CARE_PROVIDER_SITE_OTHER): Payer: Self-pay | Admitting: Obstetrics and Gynecology

## 2023-08-18 ENCOUNTER — Encounter: Payer: Self-pay | Admitting: Obstetrics and Gynecology

## 2023-08-18 VITALS — BP 121/78 | HR 92 | Wt 232.2 lb

## 2023-08-18 DIAGNOSIS — Z6841 Body Mass Index (BMI) 40.0 and over, adult: Secondary | ICD-10-CM | POA: Diagnosis not present

## 2023-08-18 DIAGNOSIS — O0932 Supervision of pregnancy with insufficient antenatal care, second trimester: Secondary | ICD-10-CM

## 2023-08-18 DIAGNOSIS — O43193 Other malformation of placenta, third trimester: Secondary | ICD-10-CM

## 2023-08-18 DIAGNOSIS — Z3A29 29 weeks gestation of pregnancy: Secondary | ICD-10-CM

## 2023-08-18 DIAGNOSIS — O0933 Supervision of pregnancy with insufficient antenatal care, third trimester: Secondary | ICD-10-CM

## 2023-08-18 DIAGNOSIS — Z23 Encounter for immunization: Secondary | ICD-10-CM

## 2023-08-18 DIAGNOSIS — Z3A28 28 weeks gestation of pregnancy: Secondary | ICD-10-CM

## 2023-08-18 DIAGNOSIS — O99213 Obesity complicating pregnancy, third trimester: Secondary | ICD-10-CM | POA: Diagnosis not present

## 2023-08-18 DIAGNOSIS — O99210 Obesity complicating pregnancy, unspecified trimester: Secondary | ICD-10-CM

## 2023-08-18 DIAGNOSIS — O99012 Anemia complicating pregnancy, second trimester: Secondary | ICD-10-CM

## 2023-08-18 DIAGNOSIS — O99013 Anemia complicating pregnancy, third trimester: Secondary | ICD-10-CM

## 2023-08-18 NOTE — Progress Notes (Signed)
   PRENATAL VISIT NOTE  Subjective:  Allison Stout is a 23 y.o. G1P0 at [redacted]w[redacted]d being seen today for ongoing prenatal care.  She is currently monitored for the following issues for this low-risk pregnancy and has GBS carrier; Anemia complicating pregnancy in second trimester; Iron deficiency anemia; Supervision of low-risk pregnancy; Obesity in pregnancy; Cystitis during pregnancy, antepartum; Vaginal bleeding in pregnancy; Late prenatal care complicating pregnancy in second trimester; Marginal insertion of umbilical cord affecting management of mother in third trimester; and BMI 40.0-44.9, adult (HCC) on their problem list.  Patient reports no complaints.  Contractions: Not present. Vag. Bleeding: None.  Movement: Present. Denies leaking of fluid.   The following portions of the patient's history were reviewed and updated as appropriate: allergies, current medications, past family history, past medical history, past social history, past surgical history and problem list.   Objective:    Vitals:   08/18/23 0830  BP: 121/78  Pulse: 92  Weight: 232 lb 3.2 oz (105.3 kg)    Fetal Status:  Fetal Heart Rate (bpm): 154   Movement: Present    General: Alert, oriented and cooperative. Patient is in no acute distress.  Skin: Skin is warm and dry. No rash noted.   Cardiovascular: Normal heart rate noted  Respiratory: Normal respiratory effort, no problems with respiration noted  Abdomen: Soft, gravid, appropriate for gestational age.  Pain/Pressure: Absent     Pelvic: Cervical exam deferred        Extremities: Normal range of motion.  Edema: None  Mental Status: Normal mood and affect. Normal behavior. Normal judgment and thought content.   Assessment and Plan:  Pregnancy: G1P0 at [redacted]w[redacted]d 1. Anemia complicating pregnancy in second trimester (Primary) - Anemia Profile B  2. [redacted] weeks gestation of pregnancy 28wk labs today. Interested in [place]. I told her how to sign up for class and  will need to cnm or md afterward to be eligible, as well as to stay low risk - Anemia Profile B  3. Marginal insertion of umbilical cord affecting management of mother in third trimester F/u mfm growth scan in two weeks  4. Late prenatal care complicating pregnancy in second trimester  5. Obesity in pregnancy  6. BMI 40.0-44.9, adult (HCC)  Preterm labor symptoms and general obstetric precautions including but not limited to vaginal bleeding, contractions, leaking of fluid and fetal movement were reviewed in detail with the patient. Please refer to After Visit Summary for other counseling recommendations.   No follow-ups on file.  Future Appointments  Date Time Provider Department Center  08/18/2023  9:40 AM [facility]  09/03/2023  2:00 PM [facility] PROVIDER 1 [facility]  09/03/2023  2:30 PM [facility] US7 [facility]    Bebe Furry, MD

## 2023-08-19 ENCOUNTER — Encounter: Payer: Self-pay | Admitting: Obstetrics and Gynecology

## 2023-08-19 LAB — ANEMIA PROFILE B
Basophils Absolute: 0 x10E3/uL (ref 0.0–0.2)
Basos: 0 %
EOS (ABSOLUTE): 0.1 x10E3/uL (ref 0.0–0.4)
Eos: 1 %
Ferritin: 5 ng/mL — ABNORMAL LOW (ref 15–150)
Folate: >20 ng/mL (ref >3.0)
Hematocrit: 33.9 % — ABNORMAL LOW (ref 34.0–46.6)
Hemoglobin: 9.9 g/dL — ABNORMAL LOW (ref 11.1–15.9)
Immature Grans (Abs): 0 x10E3/uL (ref 0.0–0.1)
Immature Granulocytes: 0 %
Iron Saturation: 5 % — CL (ref 15–55)
Iron: 27 ug/dL (ref 27–159)
Lymphocytes Absolute: 2.2 x10E3/uL (ref 0.7–3.1)
Lymphs: 22 %
MCH: 23.3 pg — ABNORMAL LOW (ref 26.6–33.0)
MCHC: 29.2 g/dL — ABNORMAL LOW (ref 31.5–35.7)
MCV: 80 fL (ref 79–97)
Monocytes Absolute: 0.7 x10E3/uL (ref 0.1–0.9)
Monocytes: 7 %
Neutrophils Absolute: 7 x10E3/uL (ref 1.4–7.0)
Neutrophils: 70 %
Platelets: 365 x10E3/uL (ref 150–450)
RBC: 4.24 x10E6/uL (ref 3.77–5.28)
RDW: 18.4 % — ABNORMAL HIGH (ref 11.7–15.4)
Retic Ct Pct: 2.1 % (ref 0.6–2.6)
Total Iron Binding Capacity: 515 ug/dL — ABNORMAL HIGH (ref 250–450)
UIBC: 488 ug/dL — ABNORMAL HIGH (ref 131–425)
Vitamin B-12: 407 pg/mL (ref 232–1245)
WBC: 10 x10E3/uL (ref 3.4–10.8)

## 2023-08-19 LAB — CBC
Hematocrit: 35.5 % (ref 34.0–46.6)
Hemoglobin: 10.1 g/dL — ABNORMAL LOW (ref 11.1–15.9)
MCH: 23.2 pg — ABNORMAL LOW (ref 26.6–33.0)
MCHC: 28.5 g/dL — ABNORMAL LOW (ref 31.5–35.7)
MCV: 81 fL (ref 79–97)
Platelets: 358 x10E3/uL (ref 150–450)
RBC: 4.36 x10E6/uL (ref 3.77–5.28)
RDW: 18.2 % — ABNORMAL HIGH (ref 11.7–15.4)
WBC: 9.9 x10E3/uL (ref 3.4–10.8)

## 2023-08-19 LAB — GLUCOSE TOLERANCE, 2 HOURS W/ 1HR
Glucose, 1 hour: 143 mg/dL (ref 70–179)
Glucose, 2 hour: 112 mg/dL (ref 70–152)
Glucose, Fasting: 78 mg/dL (ref 70–91)

## 2023-08-19 LAB — HIV ANTIBODY (ROUTINE TESTING W REFLEX): HIV Screen 4th Generation wRfx: NONREACTIVE

## 2023-08-19 LAB — RPR: RPR Ser Ql: NONREACTIVE

## 2023-08-23 ENCOUNTER — Encounter: Payer: Self-pay | Admitting: Pulmonary Disease

## 2023-08-23 ENCOUNTER — Ambulatory Visit: Payer: Self-pay | Admitting: Obstetrics and Gynecology

## 2023-08-23 DIAGNOSIS — Z3493 Encounter for supervision of normal pregnancy, unspecified, third trimester: Secondary | ICD-10-CM

## 2023-08-23 DIAGNOSIS — O99012 Anemia complicating pregnancy, second trimester: Secondary | ICD-10-CM

## 2023-08-27 ENCOUNTER — Ambulatory Visit: Payer: MEDICAID

## 2023-09-01 ENCOUNTER — Encounter: Payer: Self-pay | Admitting: Family Medicine

## 2023-09-02 ENCOUNTER — Ambulatory Visit (INDEPENDENT_AMBULATORY_CARE_PROVIDER_SITE_OTHER)

## 2023-09-02 VITALS — BP 123/87 | HR 68 | Temp 98.8°F | Resp 18 | Ht 62.0 in | Wt 239.4 lb

## 2023-09-02 DIAGNOSIS — D509 Iron deficiency anemia, unspecified: Secondary | ICD-10-CM

## 2023-09-02 DIAGNOSIS — O99013 Anemia complicating pregnancy, third trimester: Secondary | ICD-10-CM

## 2023-09-02 DIAGNOSIS — Z3A31 31 weeks gestation of pregnancy: Secondary | ICD-10-CM | POA: Diagnosis not present

## 2023-09-02 DIAGNOSIS — O99012 Anemia complicating pregnancy, second trimester: Secondary | ICD-10-CM

## 2023-09-02 DIAGNOSIS — D508 Other iron deficiency anemias: Secondary | ICD-10-CM

## 2023-09-02 MED ORDER — SODIUM CHLORIDE 0.9 % IV SOLN
1000.0000 mg | Freq: Once | INTRAVENOUS | Status: AC
  Administered 2023-09-02: 1000 mg via INTRAVENOUS
  Filled 2023-09-02: qty 10

## 2023-09-02 NOTE — Progress Notes (Signed)
 Diagnosis: Iron Deficiency Anemia  Provider:  Praveen Mannam MD  Procedure: IV Infusion  IV Type: Peripheral, IV Location: R Antecubital  Monoferric  (Ferric Derisomaltose ), Dose: 1000 mg  Infusion Start Time: 1221  Infusion Stop Time: 1249  Post Infusion IV Care: Observation period completed and Peripheral IV Discontinued  Discharge: Condition: Good, Destination: Home . AVS Declined  Performed by:  Leita FORBES Miles, LPN

## 2023-09-03 ENCOUNTER — Other Ambulatory Visit: Payer: Self-pay | Admitting: *Deleted

## 2023-09-03 ENCOUNTER — Ambulatory Visit (HOSPITAL_BASED_OUTPATIENT_CLINIC_OR_DEPARTMENT_OTHER): Payer: MEDICAID

## 2023-09-03 ENCOUNTER — Ambulatory Visit: Payer: MEDICAID | Attending: Obstetrics and Gynecology | Admitting: Maternal & Fetal Medicine

## 2023-09-03 VITALS — BP 134/83 | HR 72

## 2023-09-03 DIAGNOSIS — O99210 Obesity complicating pregnancy, unspecified trimester: Secondary | ICD-10-CM

## 2023-09-03 DIAGNOSIS — Z3490 Encounter for supervision of normal pregnancy, unspecified, unspecified trimester: Secondary | ICD-10-CM

## 2023-09-03 DIAGNOSIS — Z3A31 31 weeks gestation of pregnancy: Secondary | ICD-10-CM

## 2023-09-03 DIAGNOSIS — O99212 Obesity complicating pregnancy, second trimester: Secondary | ICD-10-CM | POA: Diagnosis not present

## 2023-09-03 DIAGNOSIS — O43193 Other malformation of placenta, third trimester: Secondary | ICD-10-CM | POA: Insufficient documentation

## 2023-09-03 DIAGNOSIS — O99013 Anemia complicating pregnancy, third trimester: Secondary | ICD-10-CM | POA: Insufficient documentation

## 2023-09-03 DIAGNOSIS — O2310 Infections of bladder in pregnancy, unspecified trimester: Secondary | ICD-10-CM

## 2023-09-03 DIAGNOSIS — E669 Obesity, unspecified: Secondary | ICD-10-CM | POA: Diagnosis not present

## 2023-09-03 DIAGNOSIS — Z3493 Encounter for supervision of normal pregnancy, unspecified, third trimester: Secondary | ICD-10-CM

## 2023-09-03 DIAGNOSIS — O43192 Other malformation of placenta, second trimester: Secondary | ICD-10-CM | POA: Diagnosis not present

## 2023-09-03 DIAGNOSIS — Z363 Encounter for antenatal screening for malformations: Secondary | ICD-10-CM | POA: Insufficient documentation

## 2023-09-03 DIAGNOSIS — O99213 Obesity complicating pregnancy, third trimester: Secondary | ICD-10-CM | POA: Insufficient documentation

## 2023-09-03 DIAGNOSIS — O4690 Antepartum hemorrhage, unspecified, unspecified trimester: Secondary | ICD-10-CM

## 2023-09-03 DIAGNOSIS — D509 Iron deficiency anemia, unspecified: Secondary | ICD-10-CM | POA: Insufficient documentation

## 2023-09-03 NOTE — Progress Notes (Signed)
 Patient information  Patient Name: MORRISON MASSER  Patient MRN:   [id number]  Referring practice: MFM Referring Provider: [facility])  Problem List   Patient Active Problem List   Diagnosis Date Noted   Marginal insertion of umbilical cord affecting management of mother in third trimester 08/18/2023   BMI 40.0-44.9, adult (HCC) 08/18/2023   Late prenatal care complicating pregnancy in second trimester 07/29/2023   Supervision of low-risk pregnancy 07/22/2023   Obesity in pregnancy 07/22/2023   Cystitis during pregnancy, antepartum 07/22/2023   Vaginal bleeding in pregnancy 07/22/2023   Anemia complicating pregnancy in second trimester 07/13/2023   Iron deficiency anemia 07/13/2023   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 G1P0 at [redacted]w[redacted]d here for ultrasound and consultation. She had Low risk aneuploidy screening of a female fetus. Carrier screening was Negative for the basic screening (SMA, alpha-thal, beta-thal, and cystic fibroisis. Maternal serum AFP n/a. She has no acute concerns.   Today we focused on the following:   Marginal cord insertion Marginal cord insertion was seen on today's ultrasound. There are no other evident fetal or placental abnormalities and the placenta is well away from the internal os.  I discussed the diagnosis, management and prognosis of pregnancy associated with the marginal umbilical cord insertion in pregnancy.  Normally the umbilical cord inserts centrally into the placenta, however, with a marginal cord insertion the umbilical cord inserts within less than 2 cm from the placental edge.  This is also known as a battledore placenta.  It occurs in about 6% of singleton pregnancies but is as high as 10 to 15% in twin pregnancies. Marginal cord insertion is even more common in monochorionic twins, and it has been associated with an increased risk for an SGA newborn in  monochorionic but not dichorionic twin pregnancies. In singleton pregnancies, there are associations with adverse centric outcomes such as  placental abruption (odds ratio 1.5), placenta previa (odds ratio 1.8), and small-for-gestational age (SGA) neonate (odds ratio 1.2), but not perinatal deaths. I explained that because of the potential for fetal growth restriction, it is recommended that serial growth ultrasounds be performed during the pregnancy.  Elevated BMI I discussed the potential complications associated with obesity in pregnancy.  These complications include but are not limited to increased risk of excessive maternal weight gain, fetal growth abnormalities, fetal congenital disorders, inability to visualize fetal anatomic structures on ultrasound, gestational diabetes, hypertensive disorders of pregnancy, operative birth including cesarean delivery or assisted vaginal delivery, delayed wound healing and many long-term health complications.  I discussed the need for continued growth ultrasounds and possibly antenatal testing depending upon how the pregnancy course progresses.  Maternal weight gain should be limited to 10 to 20 pounds during the pregnancy.  While normal weight loss may occur during the first and early second trimester, efforts to actively lose weight with the use of medication is not recommended during pregnancy.  A whole food diet and regular exercise of at least 15 to 30 minutes of moderately strenuous activity is recommended in the absence of any contraindications. Weight loss with the use of medications is not recommended during pregnancy.  If other existing comorbidities are present then 81 mg of aspirin  should be considered for preeclampsia risk reduction.   Sonographic findings Single intrauterine pregnancy at 31w 2d  Fetal cardiac activity:  Observed and appears normal. Presentation: Cephalic. The anatomic structures that were well seen  appear normal without evidence of  soft markers. Due to poor acoustic windows some structures remain suboptimally visualized. Fetal biometry shows the estimated fetal weight at the 31 percentile.  Amniotic fluid: Within normal limits.  MVP: 5.56 cm. Placenta: Posterior. Adnexa: No abnormality visualized.  There are limitations of prenatal ultrasound such as the inability to detect certain abnormalities due to poor visualization. Various factors such as fetal position, gestational age and maternal body habitus may increase the difficulty in visualizing the fetal anatomy.    Recommendations - EDD should be 11/03/2023 based on  Early Ultrasound  (03/11/23). - Follow up ultrasound in 4-6 weeks to attempt visualization of the anatomy not seen and reassess the fetal growth - Antenatal testing to start around 34 weeks for elevated BMI   Review of Systems: A review of systems was performed and was negative except per HPI   Past Obstetrical History:  OB History  Gravida Para Term Preterm AB Living  1       SAB IAB Ectopic Multiple Live Births          # Outcome Date GA Lbr Len/2nd Weight Sex Type Anes PTL Lv  1 Current              Past Medical History:  Past Medical History:  Diagnosis Date   Anemia    Cystitis 2025   Medical history non-contributory      Past Surgical History:    Past Surgical History:  Procedure Laterality Date   NO PAST SURGERIES       Home Medications:   Current Outpatient Medications on File Prior to Visit  Medication Sig Dispense Refill   aspirin  81 MG chewable tablet Chew 1 tablet (81 mg total) by mouth daily. 90 tablet 3   Prenatal Vit-Fe Fumarate-FA (PRENATAL VITAMIN PO) Take 1 tablet by mouth daily at 6 (six) AM.     No current facility-administered medications on file prior to visit.     Allergies:   No Known Allergies   Physical Exam:   Vitals:   09/03/23 1359  BP: 134/83  Pulse: 72   Sitting comfortably on the sonogram table Nonlabored breathing Normal rate and  rhythm Abdomen is nontender  Thank you for the opportunity to be involved with this patient's care. Please let us  know if we can be of any further assistance.   30 minutes of time was spent reviewing the patient's chart including labs, imaging and documentation.  At least 50% of this time was spent with direct patient care discussing the diagnosis, management and prognosis of her care.  Delora Smaller MFM, [facility]   09/03/2023  3:19 PM

## 2023-09-10 ENCOUNTER — Encounter: Admitting: Obstetrics & Gynecology

## 2023-09-15 ENCOUNTER — Encounter: Payer: Self-pay | Admitting: Family Medicine

## 2023-09-24 ENCOUNTER — Ambulatory Visit: Payer: MEDICAID | Attending: Obstetrics | Admitting: *Deleted

## 2023-09-24 ENCOUNTER — Other Ambulatory Visit: Payer: Self-pay

## 2023-09-24 ENCOUNTER — Ambulatory Visit (INDEPENDENT_AMBULATORY_CARE_PROVIDER_SITE_OTHER): Payer: Self-pay | Admitting: Obstetrics & Gynecology

## 2023-09-24 ENCOUNTER — Ambulatory Visit: Payer: MEDICAID | Admitting: *Deleted

## 2023-09-24 VITALS — BP 134/84 | HR 76 | Wt 252.3 lb

## 2023-09-24 VITALS — BP 135/71 | HR 71

## 2023-09-24 DIAGNOSIS — Z3A34 34 weeks gestation of pregnancy: Secondary | ICD-10-CM | POA: Diagnosis not present

## 2023-09-24 DIAGNOSIS — Z3493 Encounter for supervision of normal pregnancy, unspecified, third trimester: Secondary | ICD-10-CM

## 2023-09-24 DIAGNOSIS — O99213 Obesity complicating pregnancy, third trimester: Secondary | ICD-10-CM | POA: Insufficient documentation

## 2023-09-24 DIAGNOSIS — O43193 Other malformation of placenta, third trimester: Secondary | ICD-10-CM | POA: Insufficient documentation

## 2023-09-24 DIAGNOSIS — O99210 Obesity complicating pregnancy, unspecified trimester: Secondary | ICD-10-CM

## 2023-09-24 NOTE — Procedures (Signed)
 Poetry Cerro Lisanti Sep 05, 2000 [redacted]w[redacted]d  Fetus A Non-Stress Test Interpretation for 09/24/23-NST only  Indication: OBESE  Fetal Heart Rate A Mode: External Baseline Rate (A): 140 bpm Variability: Moderate Accelerations: 15 x 15 Decelerations: None Multiple birth?: No  Uterine Activity Mode: Toco Contraction Frequency (min): none Resting Tone Palpated: Relaxed  Interpretation (Fetal Testing) Nonstress Test Interpretation: Reactive Comments: Tracing reviewed by Dr. Ileana

## 2023-09-24 NOTE — Progress Notes (Signed)
   PRENATAL VISIT NOTE  Subjective:  Gavriella Hearst is a 23 y.o. G1P0 at [redacted]w[redacted]d being seen today for ongoing prenatal care.  She is currently monitored for the following issues for this high-risk pregnancy and has GBS carrier; Anemia complicating pregnancy in second trimester; Iron deficiency anemia; Supervision of low-risk pregnancy; Obesity in pregnancy; Cystitis during pregnancy, antepartum; Vaginal bleeding in pregnancy; Late prenatal care complicating pregnancy in second trimester; Marginal insertion of umbilical cord affecting management of mother in third trimester; and BMI 40.0-44.9, adult (HCC) on their problem list.  Patient reports no complaints.  Contractions: Irritability. Vag. Bleeding: None.  Movement: Present. Denies leaking of fluid.   The following portions of the patient's history were reviewed and updated as appropriate: allergies, current medications, past family history, past medical history, past social history, past surgical history and problem list.   Objective:    Vitals:   09/24/23 0904  BP: 134/84  Pulse: 76  Weight: 252 lb 4.8 oz (114.4 kg)    Fetal Status:  Fetal Heart Rate (bpm): 138   Movement: Present    General: Alert, oriented and cooperative. Patient is in no acute distress.  Skin: Skin is warm and dry. No rash noted.   Cardiovascular: Normal heart rate noted  Respiratory: Normal respiratory effort, no problems with respiration noted  Abdomen: Soft, gravid, appropriate for gestational age.  Pain/Pressure: Present     Pelvic: Cervical exam deferred        Extremities: Normal range of motion.  Edema: Trace  Mental Status: Normal mood and affect. Normal behavior. Normal judgment and thought content.   Assessment and Plan:  Pregnancy: G1P0 at [redacted]w[redacted]d 1. Encounter for supervision of low-risk pregnancy in third trimester (Primary) Nl growth  2. Obesity in pregnancy Weekly testing  Preterm labor symptoms and general obstetric precautions including  but not limited to vaginal bleeding, contractions, leaking of fluid and fetal movement were reviewed in detail with the patient. Please refer to After Visit Summary for other counseling recommendations.   Return in about 1 week (around 10/01/2023).  Future Appointments  Date Time Provider Department Center  09/24/2023 10:30 AM [facility] NURSE [facility]  09/24/2023 10:45 AM [facility] NST [facility]  09/30/2023 11:15 AM [facility] PROVIDER 1 [facility]  09/30/2023 11:30 AM [facility] US5 [facility]  10/01/2023  8:55 AM Anyanwu, Gloris LABOR, MD [facility]  10/08/2023 10:30 AM [facility] PROVIDER 1 [facility]  10/08/2023 10:45 AM [facility] NST [facility]  10/08/2023 10:55 AM Izell Harari, MD [facility]  10/14/2023  2:15 PM Claudene Crome , CNM [facility]  10/15/2023 10:30 AM [facility] PROVIDER 1 [facility]  10/15/2023 10:45 AM [facility] NST [facility]  10/22/2023  9:15 AM Emilio Delilah HERO, CNM [facility]  10/27/2023  9:55 AM Fredirick Glenys RAMAN, MD [facility]  11/03/2023  1:15 PM Ilean, Norleen GAILS, MD [facility]    Lynwood Solomons, MD

## 2023-09-30 ENCOUNTER — Ambulatory Visit

## 2023-09-30 ENCOUNTER — Other Ambulatory Visit

## 2023-10-01 ENCOUNTER — Encounter: Admitting: Obstetrics & Gynecology

## 2023-10-05 ENCOUNTER — Encounter: Payer: Self-pay | Admitting: Obstetrics and Gynecology

## 2023-10-08 ENCOUNTER — Ambulatory Visit: Admitting: Obstetrics and Gynecology

## 2023-10-08 ENCOUNTER — Other Ambulatory Visit: Payer: Self-pay | Admitting: Maternal & Fetal Medicine

## 2023-10-08 ENCOUNTER — Telehealth: Payer: Self-pay | Admitting: Family Medicine

## 2023-10-08 ENCOUNTER — Ambulatory Visit (HOSPITAL_BASED_OUTPATIENT_CLINIC_OR_DEPARTMENT_OTHER): Payer: MEDICAID

## 2023-10-08 ENCOUNTER — Other Ambulatory Visit (HOSPITAL_COMMUNITY)
Admission: RE | Admit: 2023-10-08 | Discharge: 2023-10-08 | Disposition: A | Discharge status: Home / Self Care | Source: Ambulatory Visit | Attending: Obstetrics and Gynecology | Admitting: Obstetrics and Gynecology

## 2023-10-08 ENCOUNTER — Encounter: Payer: Self-pay | Admitting: Obstetrics and Gynecology

## 2023-10-08 ENCOUNTER — Ambulatory Visit: Payer: MEDICAID | Attending: Obstetrics and Gynecology

## 2023-10-08 ENCOUNTER — Ambulatory Visit (HOSPITAL_BASED_OUTPATIENT_CLINIC_OR_DEPARTMENT_OTHER): Payer: MEDICAID | Admitting: Obstetrics and Gynecology

## 2023-10-08 ENCOUNTER — Other Ambulatory Visit: Payer: Self-pay

## 2023-10-08 VITALS — BP 135/84 | HR 89 | Wt 250.7 lb

## 2023-10-08 VITALS — BP 135/84 | HR 89

## 2023-10-08 DIAGNOSIS — Z22330 Carrier of Group B streptococcus: Secondary | ICD-10-CM

## 2023-10-08 DIAGNOSIS — O99210 Obesity complicating pregnancy, unspecified trimester: Secondary | ICD-10-CM

## 2023-10-08 DIAGNOSIS — Z3A36 36 weeks gestation of pregnancy: Secondary | ICD-10-CM

## 2023-10-08 DIAGNOSIS — O43193 Other malformation of placenta, third trimester: Secondary | ICD-10-CM

## 2023-10-08 DIAGNOSIS — O99012 Anemia complicating pregnancy, second trimester: Secondary | ICD-10-CM

## 2023-10-08 DIAGNOSIS — Z3493 Encounter for supervision of normal pregnancy, unspecified, third trimester: Secondary | ICD-10-CM | POA: Diagnosis present

## 2023-10-08 DIAGNOSIS — O99213 Obesity complicating pregnancy, third trimester: Secondary | ICD-10-CM | POA: Insufficient documentation

## 2023-10-08 DIAGNOSIS — D508 Other iron deficiency anemias: Secondary | ICD-10-CM

## 2023-10-08 DIAGNOSIS — O2313 Infections of bladder in pregnancy, third trimester: Secondary | ICD-10-CM | POA: Diagnosis not present

## 2023-10-08 DIAGNOSIS — O99013 Anemia complicating pregnancy, third trimester: Secondary | ICD-10-CM

## 2023-10-08 DIAGNOSIS — Z6841 Body Mass Index (BMI) 40.0 and over, adult: Secondary | ICD-10-CM

## 2023-10-08 DIAGNOSIS — Z3A37 37 weeks gestation of pregnancy: Secondary | ICD-10-CM | POA: Diagnosis not present

## 2023-10-08 DIAGNOSIS — O2310 Infections of bladder in pregnancy, unspecified trimester: Secondary | ICD-10-CM

## 2023-10-08 DIAGNOSIS — O365930 Maternal care for other known or suspected poor fetal growth, third trimester, not applicable or unspecified: Secondary | ICD-10-CM

## 2023-10-08 DIAGNOSIS — E669 Obesity, unspecified: Secondary | ICD-10-CM

## 2023-10-08 DIAGNOSIS — O0932 Supervision of pregnancy with insufficient antenatal care, second trimester: Secondary | ICD-10-CM

## 2023-10-08 NOTE — Progress Notes (Signed)
   PRENATAL VISIT NOTE  Subjective:  Allison Stout is a 23 y.o. G1P0 at [redacted]w[redacted]d being seen today for ongoing prenatal care.  She is currently monitored for the following issues for this high-risk pregnancy and has GBS carrier; Anemia complicating pregnancy in second trimester; Iron deficiency anemia; Supervision of low-risk pregnancy; Obesity in pregnancy; Cystitis during pregnancy, antepartum; Vaginal bleeding in pregnancy; Late prenatal care complicating pregnancy in second trimester; Marginal insertion of umbilical cord affecting management of mother in third trimester; and BMI 40.0-44.9, adult (HCC) on their problem list.  Patient reports no complaints.  Contractions: Irritability. Vag. Bleeding: None.  Movement: Present. Denies leaking of fluid.   The following portions of the patient's history were reviewed and updated as appropriate: allergies, current medications, past family history, past medical history, past social history, past surgical history and problem list.   Objective:    Vitals:   10/08/23 1043  BP: 135/84  Pulse: 89  Weight: 250 lb 11.2 oz (113.7 kg)    Fetal Status:  Fetal Heart Rate (bpm): 140   Movement: Present    General: Alert, oriented and cooperative. Patient is in no acute distress.  Skin: Skin is warm and dry. No rash noted.   Cardiovascular: Normal heart rate noted  Respiratory: Normal respiratory effort, no problems with respiration noted  Abdomen: Soft, gravid, appropriate for gestational age.  Pain/Pressure: Present     Pelvic: Cervical exam deferred        Extremities: Normal range of motion.  Edema: Trace  Mental Status: Normal mood and affect. Normal behavior. Normal judgment and thought content.   Assessment and Plan:  Pregnancy: G1P0 at [redacted]w[redacted]d 1. Encounter for supervision of low-risk pregnancy in third trimester (Primary) - Cervicovaginal ancillary only  2. BMI 40.0-44.9, adult (HCC)  3. Obesity in pregnancy  4. [redacted] weeks gestation of  pregnancy  5. 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 Other iron deficiency anemia S/p IV iron    Latest Ref Rng & Units 08/18/2023    8:31 AM 08/18/2023    8:20 AM 07/29/2023   10:50 AM  CBC  WBC 3.4 - 10.8 x10E3/uL 10.0  9.9  10.0   Hemoglobin 11.1 - 15.9 g/dL 9.9  89.8  89.8   Hematocrit 34.0 - 46.6 % 33.9  35.5  34.3   Platelets 150 - 450 x10E3/uL 365  358  375     7. Anemia complicating pregnancy in second trimester  8. Marginal insertion of umbilical cord affecting management of mother in third trimester 8/29: efw 31%, 1695g, ac 63%, afi 16.7 Follow up serial scans  9. Cystitis during pregnancy, antepartum Neg ucx 7/24  Preterm labor symptoms and general obstetric precautions including but not limited to vaginal bleeding, contractions, leaking of fluid and fetal movement were reviewed in detail with the patient. Please refer to After Visit Summary for other counseling recommendations.   No follow-ups on file.  Future Appointments  Date Time Provider Department Center  10/14/2023  2:15 PM Claudene Rector , CNM [facility]  10/15/2023 10:30 AM [facility] PROVIDER 1 [facility]  10/15/2023 10:45 AM [facility] NST [facility]  10/22/2023  9:15 AM Emilio Delilah HERO, CNM [facility]  10/27/2023  9:55 AM Fredirick Glenys RAMAN, MD [facility]  11/03/2023  1:15 PM Ilean, Norleen GAILS, MD [facility]    Bebe Furry, MD

## 2023-10-08 NOTE — Procedures (Signed)
 Allison Stout 06/01/2000 [redacted]w[redacted]d  Fetus A Non-Stress Test Interpretation for 10/08/23  Indication: Marginal cord insertion and obesity  Fetal Heart Rate A Mode: External Baseline Rate (A): 135 bpm Variability: Moderate Accelerations: 15 x 15 Decelerations: None Multiple birth?: No  Uterine Activity Mode: Palpation, Toco Contraction Frequency (min): none noted Resting Tone Palpated: Relaxed  Interpretation (Fetal Testing) Nonstress Test Interpretation: Reactive Comments: Reviewed with Dr. Arna

## 2023-10-08 NOTE — Progress Notes (Signed)
 Maternal-Fetal Medicine Consultation  Name: Jaimie Pippins  MRN: [id number]  GA: G1P0 [redacted]w[redacted]d   Patient is here for NST and had growth assessment after NST. Marginal cord insertion.  She does not have gestational diabetes.  Blood pressure today at our office is 135/84 mmHg.  Ultrasound The estimated fetal weight is at the 10th percentile and the abdominal circumference measurement is at the 18th percentile.  Femur length measurement is at the 1st percentile. Umbilical artery Doppler showed increased S/D ratio.  NST is reactive.  BPP 10/10.  Fetal growth restriction I discussed the finding of fetal growth restriction based on abnormal Doppler studies.  The estimated fetal weight is at the 10th percentile.  Although it does not meet the criteria of fetal growth restriction by weight alone, ultrasound has limitations in accurately estimating fetal weights.  I strongly suspect fetal growth restriction.  I counseled the patient that most cases of fetal growth restriction are due to placental insufficiency. I counseled the patient that abnormal Doppler studies increase the risks of perinatal mortality and morbidity.  I have recommended that delivery at [redacted] weeks gestation.  I reassured her that vaginal delivery can be safely attempted.  Patient agreed with my recommendations.  Discussed with Dr. Fredirick [facility] attending), who will be scheduling this patient for induction of labor at [redacted] weeks gestation.    Consultation including face-to-face (more than 50%) counseling 20 minutes.

## 2023-10-09 NOTE — Telephone Encounter (Signed)
 Called to inform patient of induction of labor scheduled at 37 weeks per MFM.

## 2023-10-11 ENCOUNTER — Telehealth (HOSPITAL_COMMUNITY): Payer: Self-pay | Admitting: *Deleted

## 2023-10-11 ENCOUNTER — Encounter (HOSPITAL_COMMUNITY): Payer: Self-pay | Admitting: *Deleted

## 2023-10-11 ENCOUNTER — Encounter (HOSPITAL_COMMUNITY)

## 2023-10-11 LAB — CERVICOVAGINAL ANCILLARY ONLY
Chlamydia: NEGATIVE
Comment: NEGATIVE
Comment: NORMAL
Neisseria Gonorrhea: NEGATIVE

## 2023-10-11 NOTE — Telephone Encounter (Signed)
 Preadmission screen

## 2023-10-12 ENCOUNTER — Other Ambulatory Visit: Payer: Self-pay

## 2023-10-12 DIAGNOSIS — Z3493 Encounter for supervision of normal pregnancy, unspecified, third trimester: Secondary | ICD-10-CM

## 2023-10-13 ENCOUNTER — Inpatient Hospital Stay (HOSPITAL_COMMUNITY): Payer: MEDICAID

## 2023-10-13 ENCOUNTER — Encounter (HOSPITAL_COMMUNITY): Payer: Self-pay | Admitting: Family Medicine

## 2023-10-13 ENCOUNTER — Inpatient Hospital Stay (HOSPITAL_COMMUNITY)
Admission: RE | Admit: 2023-10-13 | Discharge: 2023-10-16 | DRG: 807 | Disposition: A | Discharge status: Home / Self Care | Attending: Family Medicine | Admitting: Family Medicine

## 2023-10-13 DIAGNOSIS — Z6841 Body Mass Index (BMI) 40.0 and over, adult: Secondary | ICD-10-CM

## 2023-10-13 DIAGNOSIS — O1414 Severe pre-eclampsia complicating childbirth: Secondary | ICD-10-CM | POA: Diagnosis present

## 2023-10-13 DIAGNOSIS — O99824 Streptococcus B carrier state complicating childbirth: Secondary | ICD-10-CM | POA: Diagnosis present

## 2023-10-13 DIAGNOSIS — Z833 Family history of diabetes mellitus: Secondary | ICD-10-CM | POA: Diagnosis not present

## 2023-10-13 DIAGNOSIS — Z3A37 37 weeks gestation of pregnancy: Secondary | ICD-10-CM

## 2023-10-13 DIAGNOSIS — E66813 Obesity, class 3: Secondary | ICD-10-CM | POA: Diagnosis present

## 2023-10-13 DIAGNOSIS — O43193 Other malformation of placenta, third trimester: Secondary | ICD-10-CM | POA: Diagnosis present

## 2023-10-13 DIAGNOSIS — O99012 Anemia complicating pregnancy, second trimester: Secondary | ICD-10-CM | POA: Diagnosis not present

## 2023-10-13 DIAGNOSIS — O365990 Maternal care for other known or suspected poor fetal growth, unspecified trimester, not applicable or unspecified: Principal | ICD-10-CM | POA: Diagnosis present

## 2023-10-13 DIAGNOSIS — O99820 Streptococcus B carrier state complicating pregnancy: Secondary | ICD-10-CM | POA: Diagnosis not present

## 2023-10-13 DIAGNOSIS — Z3A25 25 weeks gestation of pregnancy: Secondary | ICD-10-CM | POA: Diagnosis not present

## 2023-10-13 DIAGNOSIS — O4423 Partial placenta previa NOS or without hemorrhage, third trimester: Secondary | ICD-10-CM | POA: Diagnosis not present

## 2023-10-13 DIAGNOSIS — O365930 Maternal care for other known or suspected poor fetal growth, third trimester, not applicable or unspecified: Principal | ICD-10-CM | POA: Diagnosis present

## 2023-10-13 DIAGNOSIS — O99214 Obesity complicating childbirth: Secondary | ICD-10-CM | POA: Diagnosis present

## 2023-10-13 DIAGNOSIS — O4690 Antepartum hemorrhage, unspecified, unspecified trimester: Secondary | ICD-10-CM

## 2023-10-13 DIAGNOSIS — D509 Iron deficiency anemia, unspecified: Secondary | ICD-10-CM | POA: Diagnosis not present

## 2023-10-13 DIAGNOSIS — Z3490 Encounter for supervision of normal pregnancy, unspecified, unspecified trimester: Secondary | ICD-10-CM | POA: Diagnosis present

## 2023-10-13 DIAGNOSIS — Z22330 Carrier of Group B streptococcus: Secondary | ICD-10-CM

## 2023-10-13 DIAGNOSIS — Z3493 Encounter for supervision of normal pregnancy, unspecified, third trimester: Secondary | ICD-10-CM

## 2023-10-13 LAB — TYPE AND SCREEN
ABO/RH(D): O POS
Antibody Screen: NEGATIVE

## 2023-10-13 LAB — CBC
HCT: 35.9 % — ABNORMAL LOW (ref 36.0–46.0)
Hemoglobin: 11.7 g/dL — ABNORMAL LOW (ref 12.0–15.0)
MCH: 27.9 pg (ref 26.0–34.0)
MCHC: 32.6 g/dL (ref 30.0–36.0)
MCV: 85.5 fL (ref 80.0–100.0)
Platelets: 230 K/uL (ref 150–400)
RBC: 4.2 MIL/uL (ref 3.87–5.11)
RDW: 22.1 % — ABNORMAL HIGH (ref 11.5–15.5)
WBC: 7.9 K/uL (ref 4.0–10.5)
nRBC: 0 % (ref 0.0–0.2)

## 2023-10-13 MED ORDER — PENICILLIN G POTASSIUM 5000000 UNITS IJ SOLR: 3.0000 10*6.[IU] | INTRAMUSCULAR | Status: DC

## 2023-10-13 MED ORDER — OXYCODONE-ACETAMINOPHEN 5-325 MG PO TABS: 1.0000 | ORAL_TABLET | ORAL | Status: DC | PRN

## 2023-10-13 MED ORDER — LACTATED RINGERS IV SOLN: INTRAVENOUS | Status: DC

## 2023-10-13 MED ORDER — SODIUM CHLORIDE 0.9% FLUSH: 3.0000 mL | Freq: Two times a day (BID) | INTRAVENOUS | Status: DC

## 2023-10-13 MED ORDER — PENICILLIN G POTASSIUM 5000000 UNITS IJ SOLR: 5.0000 10*6.[IU] | Freq: Once | INTRAMUSCULAR | Status: DC

## 2023-10-13 MED ORDER — PENICILLIN G POT IN DEXTROSE 60000 UNIT/ML IV SOLN
3.0000 10*6.[IU] | INTRAVENOUS | Status: DC
  Administered 2023-10-14 (×4): 3 10*6.[IU] via INTRAVENOUS
  Filled 2023-10-13 (×8): qty 50

## 2023-10-13 MED ORDER — SOD CITRATE-CITRIC ACID 500-334 MG/5ML PO SOLN: 30.0000 mL | ORAL | Status: DC | PRN

## 2023-10-13 MED ORDER — OXYTOCIN-SODIUM CHLORIDE 30-0.9 UT/500ML-% IV SOLN
1.0000 m[IU]/min | INTRAVENOUS | Status: DC
  Administered 2023-10-13: 2 m[IU]/min via INTRAVENOUS

## 2023-10-13 MED ORDER — SODIUM CHLORIDE 0.9 % IV SOLN: 250.0000 mL | INTRAVENOUS | Status: DC | PRN

## 2023-10-13 MED ORDER — LACTATED RINGERS IV SOLN: 500.0000 mL | INTRAVENOUS | Status: DC | PRN

## 2023-10-13 MED ORDER — ONDANSETRON HCL 4 MG/2ML IJ SOLN
4.0000 mg | Freq: Four times a day (QID) | INTRAMUSCULAR | Status: DC | PRN
  Administered 2023-10-14: 4 mg via INTRAVENOUS
  Filled 2023-10-13: qty 2

## 2023-10-13 MED ORDER — HYDROXYZINE HCL 50 MG PO TABS: 50.0000 mg | ORAL_TABLET | Freq: Four times a day (QID) | ORAL | Status: DC | PRN

## 2023-10-13 MED ORDER — OXYTOCIN-SODIUM CHLORIDE 30-0.9 UT/500ML-% IV SOLN: 2.5000 [IU]/h | INTRAVENOUS | Status: DC

## 2023-10-13 MED ORDER — SODIUM CHLORIDE 0.9% FLUSH: 3.0000 mL | INTRAVENOUS | Status: DC | PRN

## 2023-10-13 MED ORDER — TERBUTALINE SULFATE 1 MG/ML IJ SOLN: 0.2500 mg | Freq: Once | INTRAMUSCULAR | Status: DC | PRN

## 2023-10-13 MED ORDER — MISOPROSTOL 50MCG HALF TABLET: 50.0000 ug | ORAL_TABLET | ORAL | Status: DC | PRN

## 2023-10-13 MED ORDER — OXYCODONE-ACETAMINOPHEN 5-325 MG PO TABS: 2.0000 | ORAL_TABLET | ORAL | Status: DC | PRN

## 2023-10-13 MED ORDER — ACETAMINOPHEN 325 MG PO TABS: 650.0000 mg | ORAL_TABLET | ORAL | Status: DC | PRN

## 2023-10-13 MED ORDER — OXYTOCIN BOLUS FROM INFUSION
333.0000 mL | Freq: Once | INTRAVENOUS | Status: AC
  Administered 2023-10-14: 333 mL via INTRAVENOUS

## 2023-10-13 MED ORDER — SODIUM CHLORIDE 0.9 % IV SOLN
5.0000 10*6.[IU] | Freq: Once | INTRAVENOUS | Status: AC
  Administered 2023-10-13: 5 10*6.[IU] via INTRAVENOUS
  Filled 2023-10-13: qty 5

## 2023-10-13 MED ORDER — FENTANYL CITRATE (PF) 100 MCG/2ML IJ SOLN
50.0000 ug | INTRAMUSCULAR | Status: DC | PRN
  Administered 2023-10-13: 50 ug via INTRAVENOUS
  Filled 2023-10-13: qty 2

## 2023-10-13 MED ORDER — LIDOCAINE HCL (PF) 1 % IJ SOLN: 30.0000 mL | INTRAMUSCULAR | Status: DC | PRN

## 2023-10-13 MED ORDER — OXYTOCIN-SODIUM CHLORIDE 30-0.9 UT/500ML-% IV SOLN
1.0000 m[IU]/min | INTRAVENOUS | Status: DC
  Filled 2023-10-13: qty 500

## 2023-10-13 NOTE — H&P (Incomplete)
 HPI: Allison Stout is a 23 y.o. year old G1P0 female at [redacted]w[redacted]d weeks gestation {VFSPregDatingListShort:32819::US  at *** weeks } who presents to {VFSUNITPRESENTSLABOR:32818} reporting {VFSROMLABORASSESS:32820}.     Est. FW: 2403 gm 5 lb 5 oz 10 %    NURSING  PROVIDER  [employer], [employer] [employer] Dating by U/S at 6.1 wks  PNC Model Mom-Baby Dyad Anatomy U/S Nml  Initiated care at  [employer]  English              LAB RESULTS   Support Person  Genetics NIPS: No components found for: NTRARPTSUM AFP:     NT/IT (FT only)     Carrier Screen Horizon:   Rhogam  O/Positive/-- (07/24 1050) A1C/GTT Early HgbA1C:  Third trimester 2 hr GTT: neg  Flu Vaccine Declined-10/08/23    TDaP Vaccine  08/28/23 Blood Type O/Positive/-- (07/24 1050)  RSV Vaccine  Antibody Negative (07/24 1050)  COVID Vaccine  Rubella 1.55 (07/24 1050)  Feeding Plan breast RPR Non Reactive (07/24 1050)  Contraception  HBsAg Negative (07/24 1050)  Circumcision NA HIV Non Reactive (07/24 1050)  Pediatrician   HCVAb Non Reactive (07/24 1050)  Prenatal Classes     BTL Consent  Pap No results found for: DIAGPAP  BTL Pre-payment  GC/CT Initial:   36wks:    VBAC Consent  GBS   For PCN allergy, check sensitivities   BRx Optimized? [ ]  yes   [ ]  no    DME Rx [ ]  BP cuff **order when medicaid active [ ]  Weight Scale Waterbirth  [ ]  Class [ ]  Consent [ ]  CNM visit  PHQ9 & GAD7 [  ] new OB [  ] 28 weeks  [  ] 36 weeks Induction  [ ]  Orders Entered [ ] Foley Y/N     OB History     Gravida  1   Para      Term      Preterm      AB      Living         SAB      IAB      Ectopic      Multiple      Live Births             Past Medical History:  Diagnosis Date  . Anemia   . Cystitis 2025  . Medical history non-contributory    Past Surgical History:  Procedure Laterality Date  . NO PAST SURGERIES     Family History: family history includes Diabetes in her maternal  grandmother; Healthy in her father and mother. Social History:  reports that she has never smoked. She has been exposed to tobacco smoke. She has never used smokeless tobacco. She reports that she does not drink alcohol and does not use drugs.     Maternal Diabetes: {Maternal Diabetes:3043596} Genetic Screening: {Genetic Screening:20205} Maternal Ultrasounds/Referrals: {Maternal Ultrasounds / Referrals:20211} Fetal Ultrasounds or other Referrals:  {Fetal Ultrasounds or Other Referrals:20213} Maternal Substance Abuse:  {Maternal Substance Abuse:20223} Significant Maternal Medications:  {Significant Maternal Meds:20233} Significant Maternal Lab Results:  {Significant Maternal Lab Results:20235} Number of Prenatal Visits:{Prenatal Visits:27860} Maternal Vaccinations:{Maternal Immunizations:31012} Other Comments:  {Other Comments:20251}  Review of Systems History   Blood pressure (!) 137/91, pulse (!) 117, temperature 99.2 F (37.3 C), temperature source Oral, resp. rate 18, height 5' 2 (1.575 m), weight 115.8 kg, last menstrual  period 01/20/2023, SpO2 99%. Exam Physical Exam  Prenatal labs: ABO, Rh: --/--/PENDING (10/08 1653) Antibody: PENDING (10/08 1653) Rubella: 1.55 (07/24 1050) RPR: Non Reactive (08/13 0820)  HBsAg: Negative (07/24 1050)  HIV: Non Reactive (08/13 0820)  GBS: Positive/-- (04/22 0000)   Assessment/Plan: ***   Shajuan Musso 10/13/2023, 5:39 PM

## 2023-10-13 NOTE — Plan of Care (Signed)

## 2023-10-14 ENCOUNTER — Encounter (HOSPITAL_COMMUNITY): Payer: Self-pay | Admitting: Family Medicine

## 2023-10-14 ENCOUNTER — Other Ambulatory Visit: Payer: Self-pay

## 2023-10-14 ENCOUNTER — Encounter: Admitting: Advanced Practice Midwife

## 2023-10-14 ENCOUNTER — Encounter (HOSPITAL_COMMUNITY): Payer: Self-pay | Admitting: Anesthesiology

## 2023-10-14 ENCOUNTER — Inpatient Hospital Stay (HOSPITAL_COMMUNITY): Admitting: Anesthesiology

## 2023-10-14 DIAGNOSIS — O99820 Streptococcus B carrier state complicating pregnancy: Secondary | ICD-10-CM | POA: Diagnosis not present

## 2023-10-14 DIAGNOSIS — D509 Iron deficiency anemia, unspecified: Secondary | ICD-10-CM | POA: Diagnosis not present

## 2023-10-14 DIAGNOSIS — O4423 Partial placenta previa NOS or without hemorrhage, third trimester: Secondary | ICD-10-CM | POA: Diagnosis not present

## 2023-10-14 DIAGNOSIS — O99012 Anemia complicating pregnancy, second trimester: Secondary | ICD-10-CM | POA: Diagnosis not present

## 2023-10-14 DIAGNOSIS — O365930 Maternal care for other known or suspected poor fetal growth, third trimester, not applicable or unspecified: Secondary | ICD-10-CM | POA: Diagnosis not present

## 2023-10-14 DIAGNOSIS — Z3A37 37 weeks gestation of pregnancy: Secondary | ICD-10-CM | POA: Diagnosis not present

## 2023-10-14 DIAGNOSIS — Z3490 Encounter for supervision of normal pregnancy, unspecified, unspecified trimester: Secondary | ICD-10-CM | POA: Diagnosis present

## 2023-10-14 DIAGNOSIS — Z3A25 25 weeks gestation of pregnancy: Secondary | ICD-10-CM | POA: Diagnosis not present

## 2023-10-14 LAB — COMPREHENSIVE METABOLIC PANEL WITH GFR
ALT: 17 U/L (ref 0–44)
ALT: 17 U/L (ref 0–44)
ALT: 18 U/L (ref 0–44)
AST: 18 U/L (ref 15–41)
AST: 17 U/L (ref 15–41)
AST: 21 U/L (ref 15–41)
Albumin: 2.4 g/dL — ABNORMAL LOW (ref 3.5–5.0)
Albumin: 2.3 g/dL — ABNORMAL LOW (ref 3.5–5.0)
Albumin: 2.3 g/dL — ABNORMAL LOW (ref 3.5–5.0)
Alkaline Phosphatase: 116 U/L (ref 38–126)
Alkaline Phosphatase: 113 U/L (ref 38–126)
Alkaline Phosphatase: 117 U/L (ref 38–126)
Anion gap: 10 (ref 5–15)
Anion gap: 12 (ref 5–15)
Anion gap: 11 (ref 5–15)
BUN: 10 mg/dL (ref 6–20)
BUN: 11 mg/dL (ref 6–20)
BUN: 11 mg/dL (ref 6–20)
CO2: 21 mmol/L — ABNORMAL LOW (ref 22–32)
CO2: 20 mmol/L — ABNORMAL LOW (ref 22–32)
CO2: 18 mmol/L — ABNORMAL LOW (ref 22–32)
Calcium: 8.7 mg/dL — ABNORMAL LOW (ref 8.9–10.3)
Calcium: 8.8 mg/dL — ABNORMAL LOW (ref 8.9–10.3)
Calcium: 8 mg/dL — ABNORMAL LOW (ref 8.9–10.3)
Chloride: 103 mmol/L (ref 98–111)
Chloride: 102 mmol/L (ref 98–111)
Chloride: 101 mmol/L (ref 98–111)
Creatinine, Ser: 0.59 mg/dL (ref 0.44–1.00)
Creatinine, Ser: 0.6 mg/dL (ref 0.44–1.00)
Creatinine, Ser: 0.73 mg/dL (ref 0.44–1.00)
GFR, Estimated: >60 mL/min (ref >60)
GFR, Estimated: >60 mL/min (ref >60)
GFR, Estimated: >60 mL/min (ref >60)
Glucose, Bld: 87 mg/dL (ref 70–99)
Glucose, Bld: 88 mg/dL (ref 70–99)
Glucose, Bld: 103 mg/dL — ABNORMAL HIGH (ref 70–99)
Potassium: 4 mmol/L (ref 3.5–5.1)
Potassium: 4 mmol/L (ref 3.5–5.1)
Potassium: 4.1 mmol/L (ref 3.5–5.1)
Sodium: 134 mmol/L — ABNORMAL LOW (ref 135–145)
Sodium: 134 mmol/L — ABNORMAL LOW (ref 135–145)
Sodium: 130 mmol/L — ABNORMAL LOW (ref 135–145)
Total Bilirubin: 0.4 mg/dL (ref 0.0–1.2)
Total Bilirubin: 0.3 mg/dL (ref 0.0–1.2)
Total Bilirubin: 0.5 mg/dL (ref 0.0–1.2)
Total Protein: 6.2 g/dL — ABNORMAL LOW (ref 6.5–8.1)
Total Protein: 5.9 g/dL — ABNORMAL LOW (ref 6.5–8.1)
Total Protein: 5.9 g/dL — ABNORMAL LOW (ref 6.5–8.1)

## 2023-10-14 LAB — CBC
HCT: 35.2 % — ABNORMAL LOW (ref 36.0–46.0)
HCT: 35.2 % — ABNORMAL LOW (ref 36.0–46.0)
HCT: 35.1 % — ABNORMAL LOW (ref 36.0–46.0)
Hemoglobin: 11.5 g/dL — ABNORMAL LOW (ref 12.0–15.0)
Hemoglobin: 11.6 g/dL — ABNORMAL LOW (ref 12.0–15.0)
Hemoglobin: 11.5 g/dL — ABNORMAL LOW (ref 12.0–15.0)
MCH: 28.3 pg (ref 26.0–34.0)
MCH: 28.2 pg (ref 26.0–34.0)
MCH: 28.2 pg (ref 26.0–34.0)
MCHC: 32.7 g/dL (ref 30.0–36.0)
MCHC: 33 g/dL (ref 30.0–36.0)
MCHC: 32.8 g/dL (ref 30.0–36.0)
MCV: 86.5 fL (ref 80.0–100.0)
MCV: 85.6 fL (ref 80.0–100.0)
MCV: 86 fL (ref 80.0–100.0)
Platelets: 225 K/uL (ref 150–400)
Platelets: 246 K/uL (ref 150–400)
Platelets: 221 K/uL (ref 150–400)
RBC: 4.07 MIL/uL (ref 3.87–5.11)
RBC: 4.11 MIL/uL (ref 3.87–5.11)
RBC: 4.08 MIL/uL (ref 3.87–5.11)
RDW: 21.8 % — ABNORMAL HIGH (ref 11.5–15.5)
RDW: 21.9 % — ABNORMAL HIGH (ref 11.5–15.5)
RDW: 21.9 % — ABNORMAL HIGH (ref 11.5–15.5)
WBC: 9.8 K/uL (ref 4.0–10.5)
WBC: 10.3 K/uL (ref 4.0–10.5)
WBC: 16.5 K/uL — ABNORMAL HIGH (ref 4.0–10.5)
nRBC: 0 % (ref 0.0–0.2)
nRBC: 0 % (ref 0.0–0.2)
nRBC: 0 % (ref 0.0–0.2)

## 2023-10-14 LAB — PROTEIN / CREATININE RATIO, URINE
Creatinine, Urine: 125 mg/dL
Total Protein, Urine: >600 mg/dL

## 2023-10-14 LAB — RPR: RPR Ser Ql: NONREACTIVE

## 2023-10-14 MED ORDER — LIDOCAINE HCL (PF) 1 % IJ SOLN
INTRAMUSCULAR | Status: DC | PRN
  Administered 2023-10-14: 10 mL via EPIDURAL

## 2023-10-14 MED ORDER — WITCH HAZEL-GLYCERIN EX PADS: 1.0000 | MEDICATED_PAD | CUTANEOUS | Status: DC | PRN

## 2023-10-14 MED ORDER — DIPHENHYDRAMINE HCL 50 MG/ML IJ SOLN: 12.5000 mg | INTRAMUSCULAR | Status: DC | PRN

## 2023-10-14 MED ORDER — FENTANYL-BUPIVACAINE-NACL 0.5-0.125-0.9 MG/250ML-% EP SOLN
12.0000 mL/h | EPIDURAL | Status: DC | PRN
  Administered 2023-10-14: 12 mL/h via EPIDURAL
  Filled 2023-10-14: qty 250

## 2023-10-14 MED ORDER — SODIUM CHLORIDE 0.9% FLUSH: 3.0000 mL | INTRAVENOUS | Status: DC | PRN

## 2023-10-14 MED ORDER — TETANUS-DIPHTH-ACELL PERTUSSIS 5-2-15.5 LF-MCG/0.5 IM SUSP: 0.5000 mL | Freq: Once | INTRAMUSCULAR | Status: DC

## 2023-10-14 MED ORDER — ONDANSETRON HCL 4 MG PO TABS: 4.0000 mg | ORAL_TABLET | ORAL | Status: DC | PRN

## 2023-10-14 MED ORDER — EPHEDRINE 5 MG/ML INJ: 10.0000 mg | INTRAVENOUS | Status: DC | PRN

## 2023-10-14 MED ORDER — FUROSEMIDE 20 MG PO TABS
20.0000 mg | ORAL_TABLET | Freq: Every day | ORAL | Status: DC
  Administered 2023-10-14 – 2023-10-16 (×3): 20 mg via ORAL
  Filled 2023-10-14 (×3): qty 1

## 2023-10-14 MED ORDER — PHENYLEPHRINE 80 MCG/ML (10ML) SYRINGE FOR IV PUSH (FOR BLOOD PRESSURE SUPPORT): 80.0000 ug | PREFILLED_SYRINGE | INTRAVENOUS | Status: DC | PRN

## 2023-10-14 MED ORDER — SODIUM CHLORIDE 0.9 % IV SOLN: 250.0000 mL | INTRAVENOUS | Status: DC | PRN

## 2023-10-14 MED ORDER — LACTATED RINGERS IV SOLN: 500.0000 mL | Freq: Once | INTRAVENOUS | Status: DC

## 2023-10-14 MED ORDER — IBUPROFEN 800 MG PO TABS
800.0000 mg | ORAL_TABLET | Freq: Three times a day (TID) | ORAL | Status: DC
  Administered 2023-10-14 – 2023-10-16 (×5): 800 mg via ORAL
  Filled 2023-10-14 (×5): qty 1

## 2023-10-14 MED ORDER — SODIUM CHLORIDE 0.9% FLUSH
3.0000 mL | Freq: Two times a day (BID) | INTRAVENOUS | Status: DC
  Administered 2023-10-15 – 2023-10-16 (×2): 3 mL via INTRAVENOUS

## 2023-10-14 MED ORDER — ONDANSETRON HCL 4 MG/2ML IJ SOLN: 4.0000 mg | INTRAMUSCULAR | Status: DC | PRN

## 2023-10-14 MED ORDER — BENZOCAINE-MENTHOL 20-0.5 % EX AERO
1.0000 | INHALATION_SPRAY | CUTANEOUS | Status: DC | PRN
  Filled 2023-10-14: qty 56

## 2023-10-14 MED ORDER — LABETALOL HCL 5 MG/ML IV SOLN
40.0000 mg | INTRAVENOUS | Status: DC | PRN
Start: 2023-10-14 — End: 2023-10-16

## 2023-10-14 MED ORDER — ACETAMINOPHEN 325 MG PO TABS: 650.0000 mg | ORAL_TABLET | ORAL | Status: DC | PRN

## 2023-10-14 MED ORDER — COCONUT OIL OIL: 1.0000 | TOPICAL_OIL | Status: DC | PRN

## 2023-10-14 MED ORDER — NIFEDIPINE ER OSMOTIC RELEASE 30 MG PO TB24
30.0000 mg | ORAL_TABLET | Freq: Every day | ORAL | Status: DC
  Administered 2023-10-14 – 2023-10-16 (×3): 30 mg via ORAL
  Filled 2023-10-14 (×3): qty 1

## 2023-10-14 MED ORDER — SIMETHICONE 80 MG PO CHEW: 80.0000 mg | CHEWABLE_TABLET | ORAL | Status: DC | PRN

## 2023-10-14 MED ORDER — HYDRALAZINE HCL 20 MG/ML IJ SOLN: 10.0000 mg | INTRAMUSCULAR | Status: DC | PRN

## 2023-10-14 MED ORDER — LABETALOL HCL 5 MG/ML IV SOLN
20.0000 mg | INTRAVENOUS | Status: DC | PRN
  Administered 2023-10-14: 20 mg via INTRAVENOUS
  Filled 2023-10-14: qty 4

## 2023-10-14 MED ORDER — DIBUCAINE (PERIANAL) 1 % EX OINT: 1.0000 | TOPICAL_OINTMENT | CUTANEOUS | Status: DC | PRN

## 2023-10-14 MED ORDER — DIPHENHYDRAMINE HCL 25 MG PO CAPS: 25.0000 mg | ORAL_CAPSULE | Freq: Four times a day (QID) | ORAL | Status: DC | PRN

## 2023-10-14 MED ORDER — SENNOSIDES-DOCUSATE SODIUM 8.6-50 MG PO TABS
2.0000 | ORAL_TABLET | ORAL | Status: DC
  Administered 2023-10-15 – 2023-10-16 (×2): 2 via ORAL
  Filled 2023-10-14 (×2): qty 2

## 2023-10-14 MED ORDER — OXYCODONE HCL 5 MG PO TABS: 5.0000 mg | ORAL_TABLET | Freq: Four times a day (QID) | ORAL | Status: DC | PRN

## 2023-10-14 MED ORDER — MEASLES, MUMPS & RUBELLA VAC ~~LOC~~ SUSR: 0.5000 mL | Freq: Once | SUBCUTANEOUS | Status: DC

## 2023-10-14 MED ORDER — MAGNESIUM SULFATE BOLUS VIA INFUSION
4.0000 g | Freq: Once | INTRAVENOUS | Status: AC
  Administered 2023-10-14: 4 g via INTRAVENOUS
  Filled 2023-10-14: qty 1000

## 2023-10-14 MED ORDER — PRENATAL MULTIVITAMIN CH
1.0000 | ORAL_TABLET | Freq: Every day | ORAL | Status: DC
  Administered 2023-10-15 – 2023-10-16 (×2): 1 via ORAL
  Filled 2023-10-14 (×2): qty 1

## 2023-10-14 MED ORDER — LACTATED RINGERS IV SOLN: INTRAVENOUS | Status: AC

## 2023-10-14 MED ORDER — MAGNESIUM SULFATE 40 GM/1000ML IV SOLN
2.0000 g/h | INTRAVENOUS | Status: AC
  Administered 2023-10-14 – 2023-10-15 (×3): 2 g/h via INTRAVENOUS
  Filled 2023-10-14 (×2): qty 1000

## 2023-10-14 MED ORDER — POTASSIUM CHLORIDE ER 10 MEQ PO TBCR: 20.0000 meq | EXTENDED_RELEASE_TABLET | Freq: Every day | ORAL | Status: DC

## 2023-10-14 MED ORDER — LABETALOL HCL 5 MG/ML IV SOLN: 80.0000 mg | INTRAVENOUS | Status: DC | PRN

## 2023-10-14 NOTE — Progress Notes (Addendum)
 Labor Progress Note Allison Stout is a 23 y.o. G1P0 at [redacted]w[redacted]d presented for IOL for borderline FGR with elevated dopplers.   S: Doing well.  O:  BP 132/81   Pulse (!) 108   Temp 98.6 F (37 C) (Oral)   Resp 18   Ht 5' 2 (1.575 m)   Wt 115.8 kg   LMP 01/20/2023 (Approximate)   SpO2 100%   BMI 46.69 kg/m  EFM: 135/Min/Mod/Max: Moderate Variability/Accelerations (+),Decelerations (+) variable  CVE: Dilation: Lip/rim Effacement (%): 90 Station: -1 Presentation: Vertex Exam by:: Dr. Sander   A&P: 23 y.o. G1P0 104w1d  #Labor: Progressing well. 8 mU/h pitocin #Pain: Will get epidural. Expectant management #FWB: Category II - variable decels, continue to monitor #GBS positive - PCN adequate #New elevated Bps -   Lauraine Clause, Student-PA   Attestation of Supervision of Student:  I confirm that I have verified the information documented in the PA student's note and that I have also personally performed the history, physical exam and all medical decision making activities.  I have verified that all services and findings are accurately documented in this student's note; and I agree with management and plan as outlined in the documentation. I have also made any necessary editorial changes.   Barkley LITTIE Angles, MD OB Fellow 10/14/2023 7:51 PM

## 2023-10-14 NOTE — Anesthesia Preprocedure Evaluation (Deleted)
 Anesthesia Evaluation  Patient identified by MRN, date of birth, ID band Patient awake    Reviewed: Allergy & Precautions, NPO status , Patient's Chart, lab work & pertinent test results  Airway Mallampati: III  TM Distance: >3 FB Neck ROM: Full    Dental no notable dental hx.    Pulmonary neg pulmonary ROS   Pulmonary exam normal breath sounds clear to auscultation       Cardiovascular hypertension (new elevated BP), Normal cardiovascular exam Rhythm:Regular Rate:Normal     Neuro/Psych negative neurological ROS  negative psych ROS   GI/Hepatic negative GI ROS, Neg liver ROS,,,  Endo/Other    Class 3 obesity (BMI 47)  Renal/GU negative Renal ROS  negative genitourinary   Musculoskeletal negative musculoskeletal ROS (+)    Abdominal   Peds  Hematology negative hematology ROS (+)   Anesthesia Other Findings  IOL for borderline FGR with elevated dopplers  Reproductive/Obstetrics (+) Pregnancy                              Anesthesia Physical Anesthesia Plan  ASA: 3  Anesthesia Plan: Epidural   Post-op Pain Management:    Induction:   PONV Risk Score and Plan: Treatment may vary due to age or medical condition  Airway Management Planned: Natural Airway  Additional Equipment:   Intra-op Plan:   Post-operative Plan:   Informed Consent: I have reviewed the patients History and Physical, chart, labs and discussed the procedure including the risks, benefits and alternatives for the proposed anesthesia with the patient or authorized representative who has indicated his/her understanding and acceptance.       Plan Discussed with: Anesthesiologist  Anesthesia Plan Comments: (Patient identified. Risks, benefits, options discussed with patient including but not limited to bleeding, infection, nerve damage, paralysis, failed block, incomplete pain control, headache, blood pressure  changes, nausea, vomiting, reactions to medication, itching, and post partum back pain. Confirmed with bedside nurse the patient's most recent platelet count. Confirmed with the patient that they are not taking any anticoagulation, have any bleeding history or any family history of bleeding disorders. Patient expressed understanding and wishes to proceed. All questions were answered. )        Anesthesia Quick Evaluation

## 2023-10-14 NOTE — Discharge Summary (Signed)
 Postpartum Discharge Summary       Patient Name: Allison Stout DOB: 2000-04-04 MRN: [id number]  Date of admission: 10/13/2023 Delivery date:10/14/2023 Delivering provider: MAGALI BARKLEY CROME Date of discharge: 10/16/2023  Admitting diagnosis: Encounter for induction of labor [Z34.90] Intrauterine pregnancy: [redacted]w[redacted]d     Secondary diagnosis:  Principal Problem:   Fetal growth restriction antepartum Active Problems:   GBS carrier   Marginal insertion of umbilical cord affecting management of mother in third trimester   BMI 40.0-44.9, adult (HCC)   Encounter for induction of labor  Additional problems:     Discharge diagnosis: Term Pregnancy Delivered                                              Post partum procedures: Augmentation: AROM, Pitocin, and OP Foley Complications: None  Hospital course: Induction of Labor With Vaginal Delivery   23 y.o. yo G1P1001 at [redacted]w[redacted]d was admitted to the hospital 10/13/2023 for induction of labor.  Indication for induction: borderline FGR with elevated dopplers.  Patient had a labor course complicated by FGR, obesity, anemia, late prenatal care. Membrane Rupture Time/Date: 6:48 AM,10/14/2023  Delivery Method:Vaginal, Spontaneous Operative Delivery:N/A Episiotomy: None Lacerations:  1st degree;Labial Details of delivery can be found in separate delivery note.  Patient had a postpartum course complicated by. Patient is discharged home 10/16/23.  Newborn Data: Birth date:10/14/2023 Birth time:4:33 PM Gender:Female Living status:Living Apgars:7 ,9  Weight:2460 g  Magnesium Sulfate received: Yes: Seizure prophylaxis BMZ received: Yes Rhophylac:N/A MMR:N/A T-DaP:Given prenatally Flu: No RSV Vaccine received: No Transfusion:No  Immunizations received: Immunization History  Administered Date(s) Administered   Tdap 08/18/2023    Physical exam  Vitals:   10/15/23 2108 10/16/23 0008 10/16/23 0407 10/16/23 0818  BP: 129/76 137/87 122/75  (!) 146/72  Pulse: 74 97 83 87  Resp: 16 18 16 16   Temp: 98.2 F (36.8 C) 98.2 F (36.8 C) 98.5 F (36.9 C) 98.1 F (36.7 C)  TempSrc: Oral Oral Oral Oral  SpO2: 99% 99%  100%  Weight:      Height:       General: alert, cooperative, and no distress Lochia: appropriate Uterine Fundus: firm Incision:  DVT Evaluation: No evidence of DVT seen on physical exam. Labs: Lab Results  Component Value Date   WBC 16.5 (H) 10/14/2023   HGB 11.5 (L) 10/14/2023   HCT 35.1 (L) 10/14/2023   MCV 86.0 10/14/2023   PLT 221 10/14/2023      Latest Ref Rng & Units 10/14/2023    5:45 PM  CMP  Glucose 70 - 99 mg/dL 896   BUN 6 - 20 mg/dL 11   Creatinine 9.55 - 1.00 mg/dL 9.26   Sodium 864 - 854 mmol/L 130   Potassium 3.5 - 5.1 mmol/L 4.1   Chloride 98 - 111 mmol/L 101   CO2 22 - 32 mmol/L 18   Calcium 8.9 - 10.3 mg/dL 8.0   Total Protein 6.5 - 8.1 g/dL 5.9   Total Bilirubin 0.0 - 1.2 mg/dL 0.5   Alkaline Phos 38 - 126 U/L 117   AST 15 - 41 U/L 21   ALT 0 - 44 U/L 18    Edinburgh Score:    10/15/2023    2:52 AM  Edinburgh Postnatal Depression Scale Screening Tool  I have been able to laugh and see the funny side of things.  0  I have looked forward with enjoyment to things. 0  I have blamed myself unnecessarily when things went wrong. 1  I have been anxious or worried for no good reason. 0  I have felt scared or panicky for no good reason. 0  Things have been getting on top of me. 0  I have been so unhappy that I have had difficulty sleeping. 0  I have felt sad or miserable. 0  I have been so unhappy that I have been crying. 0  The thought of harming myself has occurred to me. 0  Edinburgh Postnatal Depression Scale Total 1   Edinburgh Postnatal Depression Scale Total: 1   After visit meds:  Allergies as of 10/16/2023   No Known Allergies      Medication List     STOP taking these medications    aspirin  81 MG chewable tablet       TAKE these medications     furosemide 20 MG tablet Commonly known as: LASIX Take 1 tablet (20 mg total) by mouth daily.   ibuprofen  800 MG tablet Commonly known as: ADVIL  Take 1 tablet (800 mg total) by mouth every 8 (eight) hours.   NIFEdipine 30 MG 24 hr tablet Commonly known as: ADALAT CC Take 1 tablet (30 mg total) by mouth daily.   potassium chloride SA 20 MEQ tablet Commonly known as: KLOR-CON M Take 1 tablet (20 mEq total) by mouth 2 (two) times daily.   PRENATAL VITAMIN PO Take 1 tablet by mouth daily at 6 (six) AM.         Discharge home in stable condition Infant Feeding: Bottle and Breast Infant Disposition:home with mother Discharge instruction: per After Visit Summary and Postpartum booklet. Activity: Advance as tolerated. Pelvic rest for 6 weeks.  Diet: routine diet Future Appointments: Future Appointments  Date Time Provider Department Center  10/22/2023  9:20 AM [facility] LAB [facility]  10/22/2023 10:00 AM [facility] NURSE [facility]  11/23/2023  3:35 PM Jeralyn Crutch, MD [facility]   Follow up Visit:  Follow-up Information     [facility] Follow up in 1 week(s).   Specialty: Obstetrics and Gynecology Why: BP check Contact information: [address] [place]  [phone]                 Please schedule this patient for a In person postpartum visit in 6 weeks with the following provider: Any provider. Additional Postpartum F/U:BP check 1 week  High risk pregnancy complicated by: GDM Delivery mode:  Vaginal, Spontaneous Anticipated Birth Control:  Unsure   10/16/2023 Vonn VEAR Inch, MD

## 2023-10-14 NOTE — Anesthesia Procedure Notes (Signed)
 Epidural Patient location during procedure: OB Start time: 10/14/2023 2:30 AM End time: 10/14/2023 2:40 AM  Staffing Anesthesiologist: Niels Marien CROME, MD Performed: anesthesiologist   Preanesthetic Checklist Completed: patient identified, IV checked, risks and benefits discussed, monitors and equipment checked, pre-op evaluation and timeout performed  Epidural Patient position: sitting Prep: DuraPrep and site prepped and draped Patient monitoring: continuous pulse ox, blood pressure, heart rate and cardiac monitor Approach: midline Location: L3-L4 Injection technique: LOR air  Needle:  Needle insertion depth: 7.5 cm Needle type: Tuohy  Needle gauge: 17 G Needle length: 9 cm Needle insertion depth: 7.5 cm Catheter type: closed end flexible Catheter size: 19 Gauge Catheter at skin depth: 14 cm Test dose: negative  Assessment Sensory level: T8 Events: blood not aspirated, no cerebrospinal fluid, injection not painful, no injection resistance, no paresthesia and negative IV test  Additional Notes Patient identified. Risks/Benefits/Options discussed with patient including but not limited to bleeding, infection, nerve damage, paralysis, failed block, incomplete pain control, headache, blood pressure changes, nausea, vomiting, reactions to medication both or allergic, itching and postpartum back pain. Confirmed with bedside nurse the patient's most recent platelet count. Confirmed with patient that they are not currently taking any anticoagulation, have any bleeding history or any family history of bleeding disorders. Patient expressed understanding and wished to proceed. All questions were answered. Sterile technique was used throughout the entire procedure. Please see nursing notes for vital signs. Test dose was given through epidural catheter and negative prior to continuing to dose epidural or start infusion. Warning signs of high block given to the patient including shortness of  breath, tingling/numbness in hands, complete motor block, or any concerning symptoms with instructions to call for help. Patient was given instructions on fall risk and not to get out of bed. All questions and concerns addressed with instructions to call with any issues or inadequate analgesia.  Reason for block:procedure for pain

## 2023-10-14 NOTE — Progress Notes (Signed)
 LABOR PROGRESS NOTE Chart reviewed at 0530 Second severe range bp at 0315, 160/93 Labs drawn at approx 0100  were before the 2nd severe range bp. Has an epidural now, UPC ordered. Start magnesium.   Patient seen at [phone] Patient comfortable with epidural. Pit at 6. SCE: 4/60/-3 AROM cf IUPC and FSE placed  FHT: baseline 140, mod variability, -accels, +decels/multiple morphology; overall category II. Toco: q3 min  A/P:  Magnesium gtt Titrate pitocin   Barabara Maier, DO 8:18 AM

## 2023-10-14 NOTE — Progress Notes (Addendum)
 Labor Progress Note Monik Lins is a 23 y.o. G1P0 at [redacted]w[redacted]d presented for IOL for borderline FGR with elevated dopplers.   S:  Allison Stout is tolerating her contractions well so far, stating that they so far have felt like period cramps. Reports she isn't ready for an epidural yet. FOB at bedside. Tolerated exam and Cook placement well.   O:  BP (!) 140/106   Pulse 69   Temp 98 F (36.7 C) (Oral)   Resp 16   Ht 5' 2 (1.575 m)   Wt 115.8 kg   LMP 01/20/2023 (Approximate)   SpO2 99%   BMI 46.69 kg/m   EFM: baseline 145 bpm/ moderate variability/ no accels/ no decels  Toco/IUPC: irregular SVE: Dilation: 2 Effacement (%): 10 Station: Ballotable Presentation: Vertex Exam by:: Dr Danny Pitocin: 6 mu/min  A/P: 23 y.o. G1P0 [redacted]w[redacted]d  1. Labor: IOL, pit ripening, Cook placed at 0100 2. FWB: Category I 3. Pain: epidural when ready 4. GBS: PCN adequate 5. Worsening elevated BP: pending PE labs, monitor BP  Anticipate SVD.  Terrace Compton, MD 1:05 AM   Attestation of Supervision of Resident: Evaluation and management procedures were performed by the learners: Family Medicine Resident under my supervision. I was immediately available for direct supervision, assistance and direction throughout this encounter.  I also confirm that I have verified the information documented in the resident's note, and that I have also personally reperformed the pertinent components of the physical exam and all of the medical decision making activities.  I have also made any necessary editorial changes.  Barabara Danny, DO FMOB Fellow, Faculty Practice [facility], Center for [employer]

## 2023-10-14 NOTE — Anesthesia Preprocedure Evaluation (Signed)
 Anesthesia Evaluation  Patient identified by MRN, date of birth, ID band Patient awake    Reviewed: Allergy & Precautions, NPO status , Patient's Chart, lab work & pertinent test results  Airway Mallampati: III  TM Distance: >3 FB Neck ROM: Full    Dental no notable dental hx.    Pulmonary neg pulmonary ROS   Pulmonary exam normal breath sounds clear to auscultation       Cardiovascular hypertension (newly elevated BP), Normal cardiovascular exam Rhythm:Regular Rate:Normal     Neuro/Psych negative neurological ROS  negative psych ROS   GI/Hepatic negative GI ROS, Neg liver ROS,,,  Endo/Other    Class 3 obesity (BMI 47)  Renal/GU negative Renal ROS  negative genitourinary   Musculoskeletal negative musculoskeletal ROS (+)    Abdominal   Peds  Hematology negative hematology ROS (+)   Anesthesia Other Findings   Reproductive/Obstetrics (+) Pregnancy                              Anesthesia Physical Anesthesia Plan  ASA: 3  Anesthesia Plan: Epidural   Post-op Pain Management:    Induction:   PONV Risk Score and Plan: Treatment may vary due to age or medical condition  Airway Management Planned: Natural Airway  Additional Equipment:   Intra-op Plan:   Post-operative Plan:   Informed Consent: I have reviewed the patients History and Physical, chart, labs and discussed the procedure including the risks, benefits and alternatives for the proposed anesthesia with the patient or authorized representative who has indicated his/her understanding and acceptance.       Plan Discussed with: Anesthesiologist  Anesthesia Plan Comments: (Patient identified. Risks, benefits, options discussed with patient including but not limited to bleeding, infection, nerve damage, paralysis, failed block, incomplete pain control, headache, blood pressure changes, nausea, vomiting, reactions to  medication, itching, and post partum back pain. Confirmed with bedside nurse the patient's most recent platelet count. Confirmed with the patient that they are not taking any anticoagulation, have any bleeding history or any family history of bleeding disorders. Patient expressed understanding and wishes to proceed. All questions were answered. )        Anesthesia Quick Evaluation

## 2023-10-15 ENCOUNTER — Other Ambulatory Visit

## 2023-10-15 ENCOUNTER — Encounter (HOSPITAL_COMMUNITY): Payer: Self-pay | Admitting: Family Medicine

## 2023-10-15 DIAGNOSIS — O4690 Antepartum hemorrhage, unspecified, unspecified trimester: Secondary | ICD-10-CM

## 2023-10-15 DIAGNOSIS — Z3493 Encounter for supervision of normal pregnancy, unspecified, third trimester: Secondary | ICD-10-CM

## 2023-10-15 DIAGNOSIS — O2310 Infections of bladder in pregnancy, unspecified trimester: Secondary | ICD-10-CM

## 2023-10-15 DIAGNOSIS — O99210 Obesity complicating pregnancy, unspecified trimester: Secondary | ICD-10-CM

## 2023-10-15 LAB — MAGNESIUM: Magnesium: 5 mg/dL — ABNORMAL HIGH (ref 1.7–2.4)

## 2023-10-15 MED ORDER — LACTATED RINGERS IV SOLN: INTRAVENOUS | Status: AC

## 2023-10-15 MED ORDER — LACTATED RINGERS IV SOLN: INTRAVENOUS | Status: DC

## 2023-10-15 MED ORDER — POTASSIUM CHLORIDE CRYS ER 20 MEQ PO TBCR
20.0000 meq | EXTENDED_RELEASE_TABLET | Freq: Every day | ORAL | Status: DC
  Administered 2023-10-15 – 2023-10-16 (×2): 20 meq via ORAL
  Filled 2023-10-15: qty 2
  Filled 2023-10-15 (×2): qty 1

## 2023-10-15 NOTE — Anesthesia Postprocedure Evaluation (Signed)
 Anesthesia Post Note  Patient: Orlene Randine Applebaum  Procedure(s) Performed: AN AD HOC LABOR EPIDURAL     Patient location during evaluation: OB High Risk Anesthesia Type: Epidural Level of consciousness: awake and alert Pain management: pain level controlled Vital Signs Assessment: post-procedure vital signs reviewed and stable Respiratory status: spontaneous breathing, nonlabored ventilation and respiratory function stable Cardiovascular status: stable Postop Assessment: no headache, no backache, epidural receding, no apparent nausea or vomiting, patient able to bend at knees, able to ambulate and adequate PO intake Anesthetic complications: no   No notable events documented.  Last Vitals:  Vitals:   10/15/23 0700 10/15/23 0757  BP:  133/77  Pulse:  81  Resp: 19 18  Temp:  36.7 C  SpO2:  100%    Last Pain:  Vitals:   10/15/23 0757  TempSrc: Oral  PainSc:    Pain Goal:                   Rayner Erman Hristova

## 2023-10-15 NOTE — Progress Notes (Signed)
 POSTPARTUM PROGRESS NOTE  PPD #1  Subjective:  Lanyia Jewel is a 23 y.o. G1P1001 s/p NSVD at [redacted]w[redacted]d. Today she notes she is doing well-no acute complaints. She denies any problems with ambulating, voiding or po intake. Denies nausea or vomiting. Pain is well controlled.  Lochia appropriate Denies fever/chills/chest pain/SOB.  no HA, no blurry vision, no RUQ pain  Objective: Blood pressure 134/73, pulse 84, temperature 98.3 F (36.8 C), temperature source Oral, resp. rate 17, height 5' 2 (1.575 m), weight 115.8 kg, last menstrual period 01/20/2023, SpO2 100%, unknown if currently breastfeeding.  Physical Exam:  General: alert, cooperative and no distress Chest: no respiratory distress Heart: regular rate and rhythm Abdomen: soft, nontender Uterine Fundus: firm, appropriately tender Incision: NA DVT Evaluation: No calf swelling or tenderness Extremities: no edema Skin: warm, dry  Results for orders placed or performed during the hospital encounter of 10/13/23 (from the past 24 hours)  Comprehensive metabolic panel     Status: Abnormal   Collection Time: 10/14/23  5:45 PM  Result Value Ref Range   Sodium 130 (L) 135 - 145 mmol/L   Potassium 4.1 3.5 - 5.1 mmol/L   Chloride 101 98 - 111 mmol/L   CO2 18 (L) 22 - 32 mmol/L   Glucose, Bld 103 (H) 70 - 99 mg/dL   BUN 11 6 - 20 mg/dL   Creatinine, Ser 9.26 0.44 - 1.00 mg/dL   Calcium 8.0 (L) 8.9 - 10.3 mg/dL   Total Protein 5.9 (L) 6.5 - 8.1 g/dL   Albumin 2.3 (L) 3.5 - 5.0 g/dL   AST 21 15 - 41 U/L   ALT 18 0 - 44 U/L   Alkaline Phosphatase 117 38 - 126 U/L   Total Bilirubin 0.5 0.0 - 1.2 mg/dL   GFR, Estimated >39 >39 mL/min   Anion gap 11 5 - 15  CBC     Status: Abnormal   Collection Time: 10/14/23  5:45 PM  Result Value Ref Range   WBC 16.5 (H) 4.0 - 10.5 K/uL   RBC 4.08 3.87 - 5.11 MIL/uL   Hemoglobin 11.5 (L) 12.0 - 15.0 g/dL   HCT 64.8 (L) 63.9 - 53.9 %   MCV 86.0 80.0 - 100.0 fL   MCH 28.2 26.0 - 34.0 pg    MCHC 32.8 30.0 - 36.0 g/dL   RDW 78.0 (H) 88.4 - 84.4 %   Platelets 221 150 - 400 K/uL   nRBC 0.0 0.0 - 0.2 %    Assessment/Plan: [place] Rakesha Dalporto is a 23 y.o. G1P1001 s/p NSVD at [redacted]w[redacted]d PPD#1 complicated by: 1) Preeclampsia with severe features -currently on Mag until this evening -Procardia XL 30mg  daily -Lasix 20mg  daily + K-dur daily -currently asymptomatic  2) Postpartum -routine postpartum care -pain well controlled -encourage ambulation  Contraception: discussed options- not sure Feeding: bottle  Dispo: As outlined above, possible discharge home tomorrow   LOS: 2 days   Kaci Freel, DO Faculty Attending, Center for [facility] 10/15/2023, 6:28 AM

## 2023-10-15 NOTE — Lactation Note (Signed)
 This note was copied from a baby's chart. Lactation Consultation Note  Patient Name: Allison Stout Today's Date: 10/15/2023 Age:22 hours   Spoke to [employer] O and she confirmed that feeding choice on admission is formula only. She has already reviewed lactation suppression with Ms. Fedele who is a P1. Lactation services are completed at this time.  Kaelum Kissick S Aariel Ems 10/15/2023, 9:41 AM

## 2023-10-15 NOTE — Plan of Care (Signed)
  Problem: Education: Goal: Knowledge of General Education information will improve Description: Including pain rating scale, medication(s)/side effects and non-pharmacologic comfort measures Outcome: Progressing   Problem: Health Behavior/Discharge Planning: Goal: Ability to manage health-related needs will improve Outcome: Progressing   Problem: Clinical Measurements: Goal: Ability to maintain clinical measurements within normal limits will improve Outcome: Progressing Goal: Will remain free from infection Outcome: Progressing Goal: Diagnostic test results will improve Outcome: Progressing Goal: Respiratory complications will improve Outcome: Progressing Goal: Cardiovascular complication will be avoided Outcome: Progressing   Problem: Activity: Goal: Risk for activity intolerance will decrease Outcome: Progressing   Problem: Nutrition: Goal: Adequate nutrition will be maintained Outcome: Progressing   Problem: Coping: Goal: Level of anxiety will decrease Outcome: Progressing   Problem: Elimination: Goal: Will not experience complications related to bowel motility Outcome: Progressing Goal: Will not experience complications related to urinary retention Outcome: Progressing   Problem: Pain Managment: Goal: General experience of comfort will improve and/or be controlled Outcome: Progressing   Problem: Safety: Goal: Ability to remain free from injury will improve Outcome: Progressing   Problem: Skin Integrity: Goal: Risk for impaired skin integrity will decrease Outcome: Progressing   Problem: Education: Goal: Knowledge of Childbirth will improve Outcome: Progressing Goal: Ability to make informed decisions regarding treatment and plan of care will improve Outcome: Progressing Goal: Ability to state and carry out methods to decrease the pain will improve Outcome: Progressing Goal: Individualized Educational Video(s) Outcome: Progressing   Problem:  Coping: Goal: Ability to verbalize concerns and feelings about labor and delivery will improve Outcome: Progressing   Problem: Life Cycle: Goal: Ability to make normal progression through stages of labor will improve Outcome: Progressing Goal: Ability to effectively push during vaginal delivery will improve Outcome: Progressing   Problem: Role Relationship: Goal: Will demonstrate positive interactions with the child Outcome: Progressing   Problem: Safety: Goal: Risk of complications during labor and delivery will decrease Outcome: Progressing   Problem: Pain Management: Goal: Relief or control of pain from uterine contractions will improve Outcome: Progressing   Problem: Education: Goal: Knowledge of disease or condition will improve Outcome: Progressing Goal: Knowledge of the prescribed therapeutic regimen will improve Outcome: Progressing   Problem: Fluid Volume: Goal: Peripheral tissue perfusion will improve Outcome: Progressing   Problem: Clinical Measurements: Goal: Complications related to disease process, condition or treatment will be avoided or minimized Outcome: Progressing   Problem: Education: Goal: Knowledge of condition will improve Outcome: Progressing Goal: Individualized Educational Video(s) Outcome: Progressing Goal: Individualized Newborn Educational Video(s) Outcome: Progressing   Problem: Activity: Goal: Will verbalize the importance of balancing activity with adequate rest periods Outcome: Progressing Goal: Ability to tolerate increased activity will improve Outcome: Progressing   Problem: Coping: Goal: Ability to identify and utilize available resources and services will improve Outcome: Progressing   Problem: Life Cycle: Goal: Chance of risk for complications during the postpartum period will decrease Outcome: Progressing   Problem: Role Relationship: Goal: Ability to demonstrate positive interaction with newborn will  improve Outcome: Progressing   Problem: Skin Integrity: Goal: Demonstration of wound healing without infection will improve Outcome: Progressing

## 2023-10-16 ENCOUNTER — Other Ambulatory Visit (HOSPITAL_COMMUNITY): Payer: Self-pay

## 2023-10-16 MED ORDER — FUROSEMIDE 20 MG PO TABS
20.0000 mg | ORAL_TABLET | Freq: Every day | ORAL | 0 refills | Status: AC
  Filled 2023-10-16: qty 10, 10d supply, fill #0

## 2023-10-16 MED ORDER — NIFEDIPINE ER 30 MG PO TB24
30.0000 mg | ORAL_TABLET | Freq: Every day | ORAL | 1 refills | Status: AC
  Filled 2023-10-16: qty 30, 30d supply, fill #0

## 2023-10-16 MED ORDER — POTASSIUM CHLORIDE CRYS ER 20 MEQ PO TBCR
20.0000 meq | EXTENDED_RELEASE_TABLET | Freq: Every day | ORAL | 0 refills | Status: AC
  Filled 2023-10-16: qty 10, 10d supply, fill #0

## 2023-10-16 MED ORDER — IBUPROFEN 800 MG PO TABS
800.0000 mg | ORAL_TABLET | Freq: Three times a day (TID) | ORAL | 0 refills | Status: AC
  Filled 2023-10-16: qty 30, 10d supply, fill #0

## 2023-10-16 NOTE — Plan of Care (Signed)
  Problem: Education: Goal: Knowledge of General Education information will improve Description: Including pain rating scale, medication(s)/side effects and non-pharmacologic comfort measures Outcome: Completed/Met   Problem: Health Behavior/Discharge Planning: Goal: Ability to manage health-related needs will improve Outcome: Completed/Met   Problem: Clinical Measurements: Goal: Ability to maintain clinical measurements within normal limits will improve Outcome: Completed/Met Goal: Will remain free from infection Outcome: Completed/Met Goal: Diagnostic test results will improve Outcome: Completed/Met Goal: Respiratory complications will improve Outcome: Completed/Met Goal: Cardiovascular complication will be avoided Outcome: Completed/Met   Problem: Activity: Goal: Risk for activity intolerance will decrease Outcome: Completed/Met   Problem: Nutrition: Goal: Adequate nutrition will be maintained Outcome: Completed/Met   Problem: Coping: Goal: Level of anxiety will decrease Outcome: Completed/Met   Problem: Elimination: Goal: Will not experience complications related to bowel motility Outcome: Completed/Met Goal: Will not experience complications related to urinary retention Outcome: Completed/Met   Problem: Pain Managment: Goal: General experience of comfort will improve and/or be controlled Outcome: Completed/Met   Problem: Safety: Goal: Ability to remain free from injury will improve Outcome: Completed/Met   Problem: Skin Integrity: Goal: Risk for impaired skin integrity will decrease Outcome: Completed/Met   Problem: Education: Goal: Knowledge of Childbirth will improve Outcome: Completed/Met Goal: Ability to make informed decisions regarding treatment and plan of care will improve Outcome: Completed/Met Goal: Ability to state and carry out methods to decrease the pain will improve Outcome: Completed/Met Goal: Individualized Educational Video(s) Outcome:  Completed/Met   Problem: Coping: Goal: Ability to verbalize concerns and feelings about labor and delivery will improve Outcome: Completed/Met   Problem: Life Cycle: Goal: Ability to make normal progression through stages of labor will improve Outcome: Completed/Met Goal: Ability to effectively push during vaginal delivery will improve Outcome: Completed/Met   Problem: Role Relationship: Goal: Will demonstrate positive interactions with the child Outcome: Completed/Met   Problem: Safety: Goal: Risk of complications during labor and delivery will decrease Outcome: Completed/Met   Problem: Pain Management: Goal: Relief or control of pain from uterine contractions will improve Outcome: Completed/Met   Problem: Education: Goal: Knowledge of disease or condition will improve Outcome: Completed/Met Goal: Knowledge of the prescribed therapeutic regimen will improve Outcome: Completed/Met   Problem: Fluid Volume: Goal: Peripheral tissue perfusion will improve Outcome: Completed/Met   Problem: Clinical Measurements: Goal: Complications related to disease process, condition or treatment will be avoided or minimized Outcome: Completed/Met   Problem: Education: Goal: Knowledge of condition will improve Outcome: Completed/Met Goal: Individualized Educational Video(s) Outcome: Completed/Met Goal: Individualized Newborn Educational Video(s) Outcome: Completed/Met   Problem: Activity: Goal: Will verbalize the importance of balancing activity with adequate rest periods Outcome: Completed/Met Goal: Ability to tolerate increased activity will improve Outcome: Completed/Met   Problem: Coping: Goal: Ability to identify and utilize available resources and services will improve Outcome: Completed/Met   Problem: Life Cycle: Goal: Chance of risk for complications during the postpartum period will decrease Outcome: Completed/Met   Problem: Role Relationship: Goal: Ability to  demonstrate positive interaction with newborn will improve Outcome: Completed/Met   Problem: Skin Integrity: Goal: Demonstration of wound healing without infection will improve Outcome: Completed/Met

## 2023-10-18 ENCOUNTER — Other Ambulatory Visit: Payer: Self-pay

## 2023-10-18 DIAGNOSIS — Z3490 Encounter for supervision of normal pregnancy, unspecified, unspecified trimester: Secondary | ICD-10-CM

## 2023-10-22 ENCOUNTER — Ambulatory Visit

## 2023-10-22 ENCOUNTER — Encounter: Admitting: Certified Nurse Midwife

## 2023-10-22 ENCOUNTER — Other Ambulatory Visit

## 2023-10-23 ENCOUNTER — Telehealth (HOSPITAL_COMMUNITY): Payer: Self-pay | Admitting: *Deleted

## 2023-10-23 NOTE — Telephone Encounter (Signed)
 Attempted hospital discharge follow-up call. Received message stating, Voicemail has not been set up.  Allean IVAR Carton, RN, 10/23/23, [phone]

## 2023-10-27 ENCOUNTER — Encounter: Admitting: Family Medicine

## 2023-10-29 NOTE — Telephone Encounter (Signed)
  error

## 2023-11-03 ENCOUNTER — Encounter: Admitting: Family Medicine

## 2023-11-23 ENCOUNTER — Ambulatory Visit: Admitting: Obstetrics and Gynecology

## 2023-11-26 ENCOUNTER — Ambulatory Visit (INDEPENDENT_AMBULATORY_CARE_PROVIDER_SITE_OTHER): Admitting: Certified Nurse Midwife

## 2023-11-26 ENCOUNTER — Other Ambulatory Visit: Payer: Self-pay

## 2023-11-26 DIAGNOSIS — Z3009 Encounter for other general counseling and advice on contraception: Secondary | ICD-10-CM | POA: Diagnosis not present

## 2023-11-26 DIAGNOSIS — Z23 Encounter for immunization: Secondary | ICD-10-CM

## 2023-11-26 NOTE — Progress Notes (Signed)
 Post Partum Visit Note  Allison Stout is a 23 y.o. G49P1001 female who presents for a postpartum visit. She is 6.1 weeks postpartum following a normal spontaneous vaginal delivery.  I have fully reviewed the prenatal and intrapartum course. The delivery was at 37.1 gestational weeks.  Anesthesia: epidural. Postpartum course has been uncomplicated. Baby is doing well. Baby is feeding by bottle - Similac Sensitive RS. Bleeding menstrual cycle started yesterday. Bowel function is normal. Bladder function is normal. Patient is sexually active. Contraception method is condoms. Postpartum depression screening: negative.   The pregnancy intention screening data noted above was reviewed. Potential methods of contraception were discussed. The patient elected to proceed with No data recorded.   Edinburgh Postnatal Depression Scale - 11/26/23 1142       Edinburgh Postnatal Depression Scale:  In the Past 7 Days   I have been able to laugh and see the funny side of things. 0    I have looked forward with enjoyment to things. 0    I have blamed myself unnecessarily when things went wrong. 0    I have been anxious or worried for no good reason. 0    I have felt scared or panicky for no good reason. 0    Things have been getting on top of me. 0    I have been so unhappy that I have had difficulty sleeping. 0    I have felt sad or miserable. 0    I have been so unhappy that I have been crying. 0    The thought of harming myself has occurred to me. 0    Edinburgh Postnatal Depression Scale Total 0          Health Maintenance Due  Topic Date Due   HPV VACCINES (1 - 3-dose series) Never done   Meningococcal B Vaccine (1 of 2 - Standard) Never done   Hepatitis B Vaccines 19-59 Average Risk (1 of 3 - 19+ 3-dose series) Never done   Cervical Cancer Screening (Pap smear)  Never done   COVID-19 Vaccine (1 - 2025-26 season) Never done    The following portions of the patient's history were  reviewed and updated as appropriate: allergies, current medications, past family history, past medical history, past social history, past surgical history, and problem list.  Review of Systems Pertinent items are noted in HPI.  Objective:  BP 131/72   Pulse 92   Wt 233 lb (105.7 kg)   LMP 11/25/2023 (Exact Date)   Breastfeeding No   BMI 42.62 kg/m    General:  alert, cooperative, and appears stated age   Breasts:  normal  Lungs: clear to auscultation bilaterally  Heart:  regular rate and rhythm, S1, S2 normal, no murmur, click, rub or gallop  Abdomen: soft, non-tender; bowel sounds normal; no masses,  no organomegaly   Wound NA  GU exam:  normal       Assessment:    1. Postpartum care and examination - Normal exam.  - Defers pap today. -  2. Flu vaccine need - Administered by RN staff.   3. Advice on female contraception - Interested in LARC, more than likely Mirena   Benign postpartum exam.   Plan:   Essential components of care per ACOG recommendations:  1.  Mood and well being: Patient with negative depression screening today. Reviewed local resources for support.  - Patient tobacco use? No.   - hx of drug use? No.  2. Infant care and feeding:  -Patient currently breastmilk feeding? Yes. Reviewed importance of draining breast regularly to support lactation.  -Social determinants of health (SDOH) reviewed in EPIC. No concerns  3. Sexuality, contraception and birth spacing - Patient does not want a pregnancy in the next year.  Desired family size is 1 children.  - Reviewed reproductive life planning. Reviewed contraceptive methods based on pt preferences and effectiveness.  Patient desired No Method - Other Reason today.   - Discussed birth spacing of 18 months  4. Sleep and fatigue -Encouraged family/partner/community support of 4 hrs of uninterrupted sleep to help with mood and fatigue  5. Physical Recovery  - Discussed patients delivery and  complications. She describes her labor as good. - Patient had a Vaginal, no problems at delivery. Patient had a 1st degree laceration. Perineal healing reviewed. Patient expressed understanding - Patient has urinary incontinence? No. - Patient is safe to resume physical and sexual activity  6.  Health Maintenance - HM due items addressed No - patient desires to return to clinic for LARC and PAP - Last pap smear No results found for: DIAGPAP Pap smear not done at today's visit.  -Breast Cancer screening indicated? No.   7. Chronic Disease/Pregnancy Condition follow up: None  - PCP follow up  Camie DELENA Rote, CNM Center for [employer], [facility]

## 2024-01-20 ENCOUNTER — Ambulatory Visit: Admitting: Obstetrics and Gynecology
